# Patient Record
Sex: Male | Born: 1960 | Race: Black or African American | Hispanic: No | Marital: Married | Smoking: Never smoker
Health system: Southern US, Community
[De-identification: ages and names within clinical notes are randomized; demographics above are authoritative.]

## PROBLEM LIST (undated history)

## (undated) DIAGNOSIS — I10 Essential (primary) hypertension: Secondary | ICD-10-CM

## (undated) DIAGNOSIS — E119 Type 2 diabetes mellitus without complications: Secondary | ICD-10-CM

## (undated) DIAGNOSIS — E7800 Pure hypercholesterolemia, unspecified: Secondary | ICD-10-CM

## (undated) HISTORY — PX: SPINAL FUSION: SHX223

---

## 2005-07-30 ENCOUNTER — Ambulatory Visit (HOSPITAL_COMMUNITY): Admission: RE | Admit: 2005-07-30 | Discharge: 2005-07-30 | Payer: Self-pay | Admitting: Neurosurgery

## 2005-08-12 ENCOUNTER — Ambulatory Visit (HOSPITAL_COMMUNITY): Admission: RE | Admit: 2005-08-12 | Discharge: 2005-08-13 | Payer: Self-pay | Admitting: Neurosurgery

## 2011-11-22 ENCOUNTER — Emergency Department (HOSPITAL_COMMUNITY)
Admission: EM | Admit: 2011-11-22 | Discharge: 2011-11-22 | Discharge status: Absent without leave | Payer: Self-pay | Source: Home / Self Care

## 2012-02-09 ENCOUNTER — Encounter (HOSPITAL_BASED_OUTPATIENT_CLINIC_OR_DEPARTMENT_OTHER): Payer: Self-pay | Admitting: *Deleted

## 2012-02-09 ENCOUNTER — Emergency Department (HOSPITAL_BASED_OUTPATIENT_CLINIC_OR_DEPARTMENT_OTHER)
Admission: EM | Admit: 2012-02-09 | Discharge: 2012-02-09 | Disposition: A | Discharge status: Home / Self Care | Payer: 59 | Attending: Emergency Medicine | Admitting: Emergency Medicine

## 2012-02-09 ENCOUNTER — Emergency Department (INDEPENDENT_AMBULATORY_CARE_PROVIDER_SITE_OTHER): Payer: 59

## 2012-02-09 DIAGNOSIS — I10 Essential (primary) hypertension: Secondary | ICD-10-CM | POA: Insufficient documentation

## 2012-02-09 DIAGNOSIS — M549 Dorsalgia, unspecified: Secondary | ICD-10-CM

## 2012-02-09 DIAGNOSIS — N3080 Other cystitis without hematuria: Secondary | ICD-10-CM

## 2012-02-09 DIAGNOSIS — R109 Unspecified abdominal pain: Secondary | ICD-10-CM

## 2012-02-09 DIAGNOSIS — M545 Low back pain, unspecified: Secondary | ICD-10-CM | POA: Insufficient documentation

## 2012-02-09 DIAGNOSIS — E7800 Pure hypercholesterolemia, unspecified: Secondary | ICD-10-CM | POA: Insufficient documentation

## 2012-02-09 DIAGNOSIS — R319 Hematuria, unspecified: Secondary | ICD-10-CM | POA: Insufficient documentation

## 2012-02-09 HISTORY — DX: Pure hypercholesterolemia, unspecified: E78.00

## 2012-02-09 HISTORY — DX: Essential (primary) hypertension: I10

## 2012-02-09 LAB — URINALYSIS, MICROSCOPIC ONLY
Bilirubin Urine: NEGATIVE
Ketones, ur: 15 mg/dL — AB
Leukocytes, UA: NEGATIVE
Protein, ur: NEGATIVE mg/dL
Specific Gravity, Urine: 1.021 (ref 1.005–1.030)
pH: 6 (ref 5.0–8.0)

## 2012-02-09 MED ORDER — HYDROCODONE-ACETAMINOPHEN 5-325 MG PO TABS: 2.0000 | ORAL_TABLET | ORAL | Status: AC | PRN

## 2012-02-09 NOTE — ED Provider Notes (Signed)
Medical screening examination/treatment/procedure(s) were performed by non-physician practitioner and as supervising physician I was immediately available for consultation/collaboration.  Microscopic hematuria. To f/u with urology.  Forbes Cellar, MD 02/09/12 2311
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
## 2012-02-09 NOTE — Discharge Instructions (Signed)
Back Pain, Adult Back pain is very common. The pain often gets better over time. The cause of back pain is usually not dangerous. Most people can learn to manage their back pain on their own.  HOME CARE   Stay active. Start with short walks on flat ground if you can. Try to walk farther each day.   Do not sit, drive, or stand in one place for more than 30 minutes. Do not stay in bed.   Do not avoid exercise or work. Activity can help your back heal faster.   Be careful when you bend or lift an object. Bend at your knees, keep the object close to you, and do not twist.   Sleep on a firm mattress. Lie on your side, and bend your knees. If you lie on your back, put a pillow under your knees.   Only take medicines as told by your doctor.   Put ice on the injured area.   Put ice in a plastic bag.   Place a towel between your skin and the bag.   Leave the ice on for 15 to 20 minutes, 3 to 4 times a day for the first 2 to 3 days. After that, you can switch between ice and heat packs.   Ask your doctor about back exercises or massage.   Avoid feeling anxious or stressed. Find good ways to deal with stress, such as exercise.  GET HELP RIGHT AWAY IF:   Your pain does not go away with rest or medicine.   Your pain does not go away in 1 week.   You have new problems.   You do not feel well.   The pain spreads into your legs.   You cannot control when you poop (bowel movement) or pee (urinate).   Your arms or legs feel weak or lose feeling (numbness).   You feel sick to your stomach (nauseous) or throw up (vomit).   You have belly (abdominal) pain.   You feel like you may pass out (faint).  MAKE SURE YOU:   Understand these instructions.   Will watch your condition.   Will get help right away if you are not doing well or get worse.  Document Released: 05/10/2008 Document Revised: 11/11/2011 Document Reviewed: 04/12/2011 [facility] Patient Information 2012 [place],  [place].Hematuria, Adult Hematuria (blood in your urine) can be caused by a bladder infection (cystitis), kidney infection (pyelonephritis), prostate infection (prostatitis), or kidney stone. Infections will usually respond to antibiotics (medications which kill germs), and a kidney stone will usually pass through your urine without further treatment. If you were put on antibiotics, take all the medicine until gone. You may feel better in a few days, but take all of your medicine or the infection may not respond and become more difficult to treat. If antibiotics were not given, an infection did not cause the blood in the urine. A further work up to find out the reason may be needed. HOME CARE INSTRUCTIONS   Drink lots of fluid, 3 to 4 quarts a day. If you have been diagnosed with an infection, cranberry juice is especially recommended, in addition to large amounts of water.   Avoid caffeine, tea, and carbonated beverages, because they tend to irritate the bladder.   Avoid alcohol as it may irritate the prostate.   Only take over-the-counter or prescription medicines for pain, discomfort, or fever as directed by your caregiver.   If you have been diagnosed with a kidney stone follow your  caregivers instructions regarding straining your urine to catch the stone.  TO PREVENT FURTHER INFECTIONS:  Empty the bladder often. Avoid holding urine for long periods of time.   After a bowel movement, women should cleanse front to back. Use each tissue only once.   Empty the bladder before and after sexual intercourse if you are a male.   Return to your caregiver if you develop back pain, fever, nausea (feeling sick to your stomach), vomiting, or your symptoms (problems) are not better in 3 days. Return sooner if you are getting worse.  If you have been requested to return for further testing make sure to keep your appointments. If an infection is not the cause of blood in your urine, X-rays may be required.  Your caregiver will discuss this with you. SEEK IMMEDIATE MEDICAL CARE IF:   You have a persistent fever over 102 F (38.9 C).   You develop severe vomiting and are unable to keep the medication down.   You develop severe back or abdominal pain despite taking your medications.   You begin passing a large amount of blood or clots in your urine.   You feel extremely weak or faint, or pass out.  MAKE SURE YOU:   Understand these instructions.   Will watch your condition.   Will get help right away if you are not doing well or get worse.  Document Released: 11/22/2005 Document Revised: 11/11/2011 Document Reviewed: 07/11/2008 [facility] Patient Information 2012 [place], [place].
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
## 2016-09-13 ENCOUNTER — Emergency Department (HOSPITAL_COMMUNITY): Payer: Self-pay

## 2016-09-13 ENCOUNTER — Encounter (HOSPITAL_COMMUNITY): Payer: Self-pay | Admitting: Emergency Medicine

## 2016-09-13 ENCOUNTER — Emergency Department (HOSPITAL_COMMUNITY)
Admission: EM | Admit: 2016-09-13 | Discharge: 2016-09-13 | Disposition: A | Discharge status: Home / Self Care | Payer: Self-pay | Attending: Emergency Medicine | Admitting: Emergency Medicine

## 2016-09-13 DIAGNOSIS — Z7984 Long term (current) use of oral hypoglycemic drugs: Secondary | ICD-10-CM | POA: Insufficient documentation

## 2016-09-13 DIAGNOSIS — E119 Type 2 diabetes mellitus without complications: Secondary | ICD-10-CM | POA: Insufficient documentation

## 2016-09-13 DIAGNOSIS — Z79899 Other long term (current) drug therapy: Secondary | ICD-10-CM | POA: Insufficient documentation

## 2016-09-13 DIAGNOSIS — R55 Syncope and collapse: Secondary | ICD-10-CM | POA: Insufficient documentation

## 2016-09-13 DIAGNOSIS — I10 Essential (primary) hypertension: Secondary | ICD-10-CM | POA: Insufficient documentation

## 2016-09-13 HISTORY — DX: Type 2 diabetes mellitus without complications: E11.9

## 2016-09-13 LAB — CBC
HCT: 38.4 % — ABNORMAL LOW (ref 39.0–52.0)
Hemoglobin: 12.6 g/dL — ABNORMAL LOW (ref 13.0–17.0)
MCH: 27.2 pg (ref 26.0–34.0)
MCHC: 32.8 g/dL (ref 30.0–36.0)
MCV: 82.8 fL (ref 78.0–100.0)
Platelets: 253 10*3/uL (ref 150–400)
RBC: 4.64 MIL/uL (ref 4.22–5.81)
RDW: 14 % (ref 11.5–15.5)
WBC: 3.7 10*3/uL — ABNORMAL LOW (ref 4.0–10.5)

## 2016-09-13 LAB — BASIC METABOLIC PANEL
Anion gap: 10 (ref 5–15)
BUN: 19 mg/dL (ref 6–20)
CO2: 25 mmol/L (ref 22–32)
Calcium: 9.7 mg/dL (ref 8.9–10.3)
Chloride: 99 mmol/L — ABNORMAL LOW (ref 101–111)
Creatinine, Ser: 1.39 mg/dL — ABNORMAL HIGH (ref 0.61–1.24)
GFR calc Af Amer: >60 mL/min (ref >60)
GFR calc non Af Amer: 56 mL/min — ABNORMAL LOW (ref >60)
Glucose, Bld: 181 mg/dL — ABNORMAL HIGH (ref 65–99)
Potassium: 3.4 mmol/L — ABNORMAL LOW (ref 3.5–5.1)
Sodium: 134 mmol/L — ABNORMAL LOW (ref 135–145)

## 2016-09-13 LAB — I-STAT TROPONIN, ED
TROPONIN I, POC: 0.01 ng/mL (ref 0.00–0.08)
Troponin i, poc: 0.01 ng/mL (ref 0.00–0.08)

## 2016-09-13 LAB — D-DIMER, QUANTITATIVE: D-Dimer, Quant: <0.27 ug/mL-FEU (ref 0.00–0.50)

## 2016-09-13 LAB — URINE MICROSCOPIC-ADD ON

## 2016-09-13 LAB — URINALYSIS, ROUTINE W REFLEX MICROSCOPIC
Bilirubin Urine: NEGATIVE
Glucose, UA: >1000 mg/dL — AB
Hgb urine dipstick: NEGATIVE
Ketones, ur: NEGATIVE mg/dL
Leukocytes, UA: NEGATIVE
Nitrite: NEGATIVE
Protein, ur: NEGATIVE mg/dL
Specific Gravity, Urine: 1.027 (ref 1.005–1.030)
pH: 5.5 (ref 5.0–8.0)

## 2016-09-13 NOTE — Discharge Instructions (Signed)
All of your labs and imaging have been unremarkable today. Please continue to take all the medications and drink plenty of fluids. Continue to check her blood sugar regularly. He also had follow-up with her primary care doctor outpatient for further workup. Please return to the ED if you develop chest pain, shortness of breath or for any other reason.
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
## 2016-09-13 NOTE — ED Notes (Signed)
Bed: WA06 Expected date:  Expected time:  Means of arrival:  Comments: 55 yo M, syncope

## 2016-09-13 NOTE — ED Triage Notes (Signed)
Per EMS, Pt from work. Pt had syncopal episode, unwitnessed. Pt denies injury from the fall. After initial syncopal episode pt still felt lightheaded. A&Ox4, ambulatory.

## 2020-08-21 ENCOUNTER — Other Ambulatory Visit: Payer: Self-pay

## 2020-08-21 ENCOUNTER — Other Ambulatory Visit: Payer: Self-pay | Admitting: *Deleted

## 2020-08-21 DIAGNOSIS — Z20822 Contact with and (suspected) exposure to covid-19: Secondary | ICD-10-CM

## 2020-08-23 LAB — SARS-COV-2, NAA 2 DAY TAT

## 2020-08-23 LAB — NOVEL CORONAVIRUS, NAA: SARS-CoV-2, NAA: NOT DETECTED

## 2020-08-23 LAB — SPECIMEN STATUS REPORT

## 2021-03-19 ENCOUNTER — Other Ambulatory Visit: Payer: Self-pay | Admitting: Physician Assistant

## 2021-03-19 DIAGNOSIS — M5417 Radiculopathy, lumbosacral region: Secondary | ICD-10-CM

## 2021-03-22 ENCOUNTER — Other Ambulatory Visit: Payer: Self-pay

## 2021-03-22 ENCOUNTER — Ambulatory Visit
Admission: RE | Admit: 2021-03-22 | Discharge: 2021-03-22 | Disposition: A | Discharge status: Home / Self Care | Payer: Managed Care, Other (non HMO) | Source: Ambulatory Visit | Attending: Physician Assistant | Admitting: Physician Assistant

## 2021-03-22 DIAGNOSIS — M5417 Radiculopathy, lumbosacral region: Secondary | ICD-10-CM

## 2021-11-20 IMAGING — MR MR LUMBAR SPINE W/O CM
4 of 5 series · 18 of 48 positions shown · non-contrast
Comparison: None available.

CLINICAL DATA: Initial evaluation for intermittent left-sided lower
back pain since 4188, numbness and weakness in left lower extremity.

EXAM:
MRI LUMBAR SPINE WITHOUT CONTRAST
TECHNIQUE: Multiplanar, multisequence MR imaging of the lumbar spine was
performed. No intravenous contrast was administered.

[Series 5: T2 · sagittal · 4.0mm · 0.73mm/px · 6 of 15 slices shown (1 of 2)]
[im 1/15]
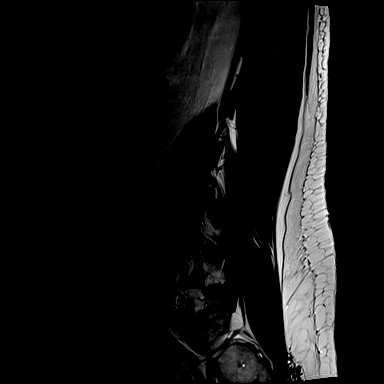
[im 3/15]
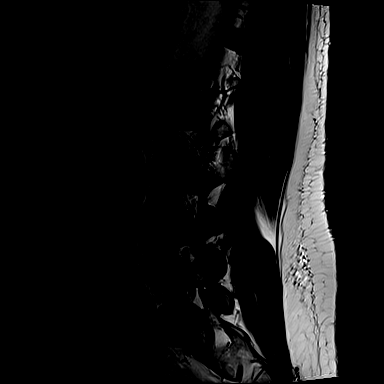
[im 6/15]
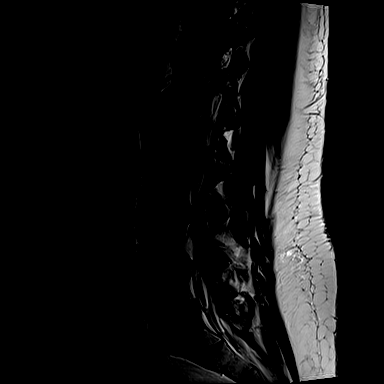
[im 9/15]
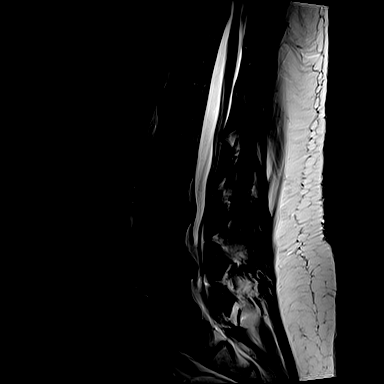
[im 12/15]
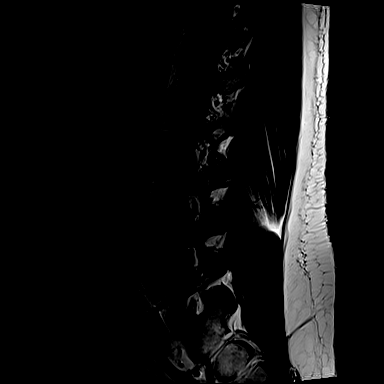
[im 15/15]
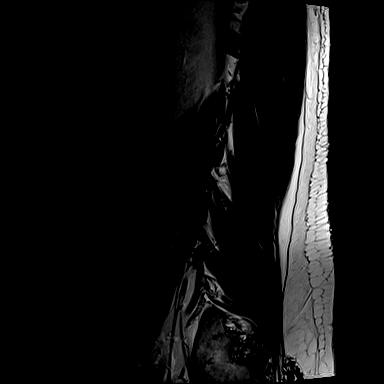

[Series 6: T1 · sagittal · 4.0mm · 0.73mm/px · 3 of 15 slices shown (1 of 2)]
[im 1/15]
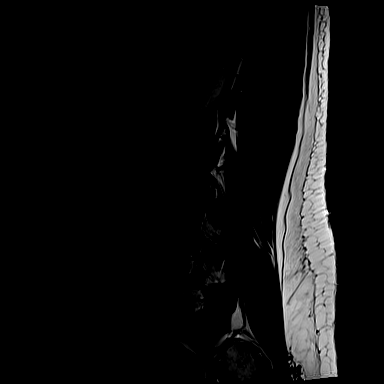
[im 8/15]
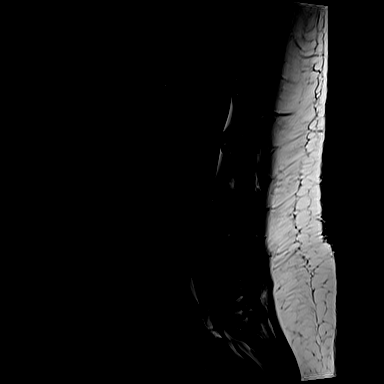
[im 15/15]
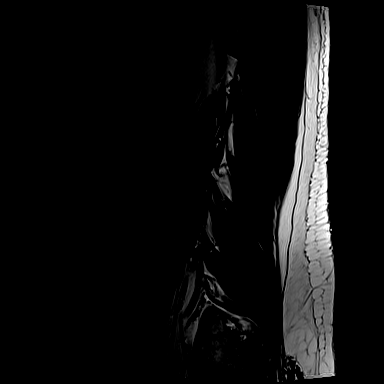

[Series 11: T2 · axial · 4.0mm · 0.28mm/px · z∈[-126,+73]mm · 6 of 45 slices shown (2 of 2)]
[im 3/45]
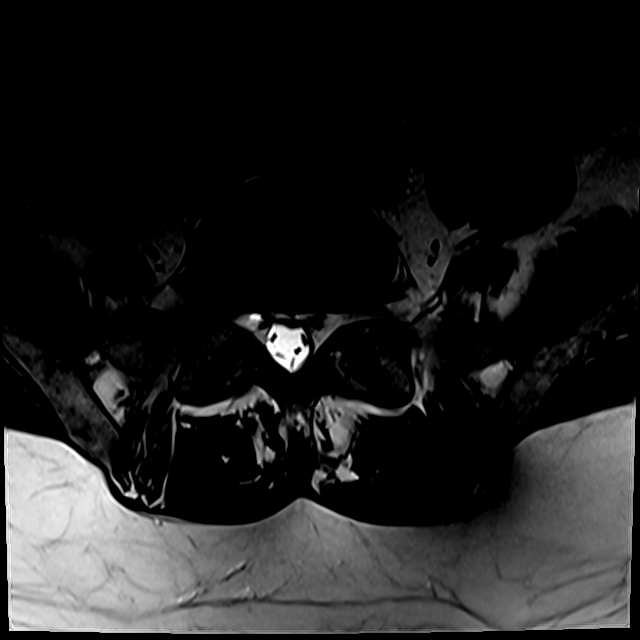
[im 6/45]
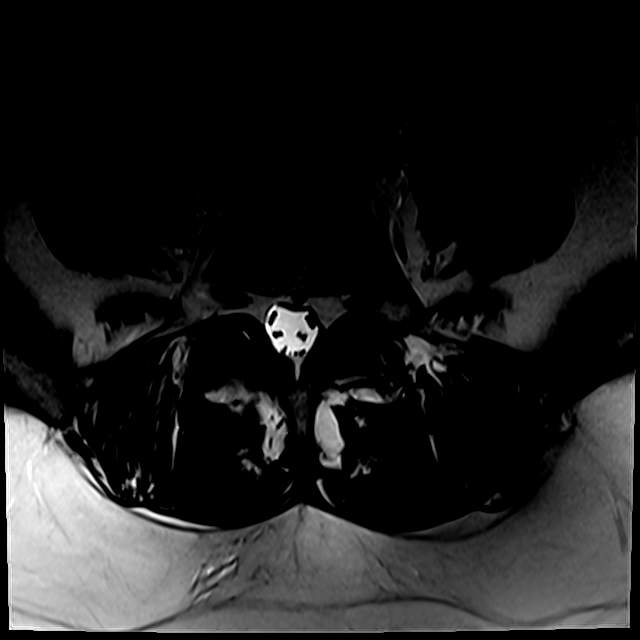
[im 9/45]
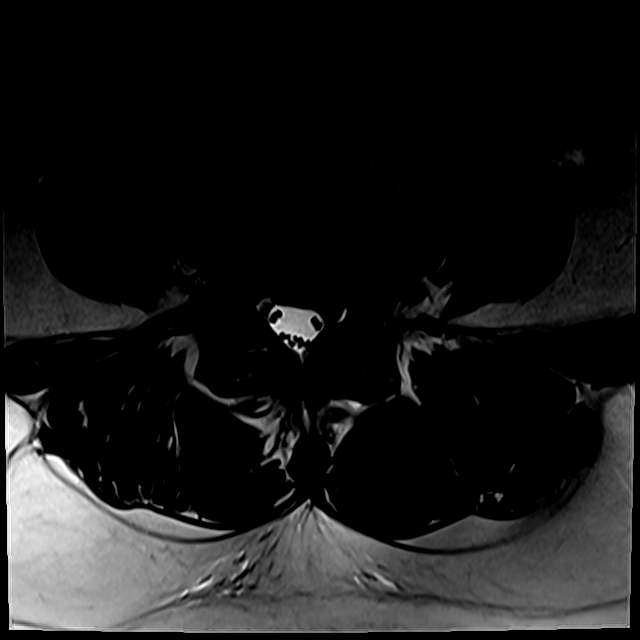
[im 15/45]
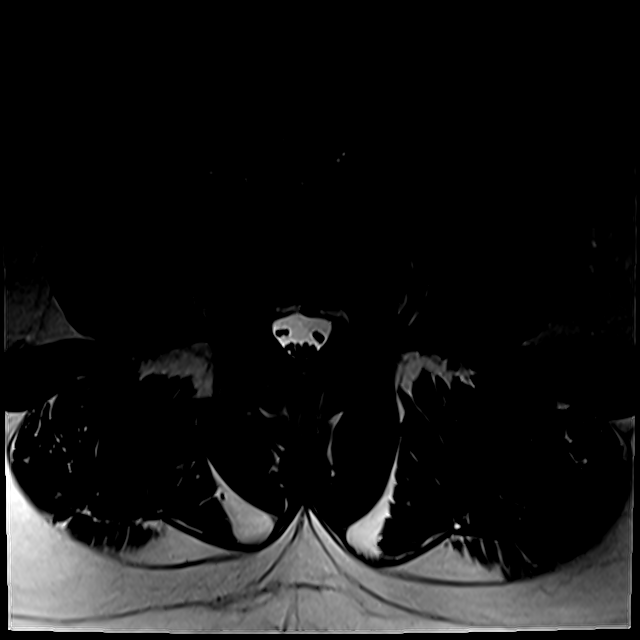
[im 24/45]
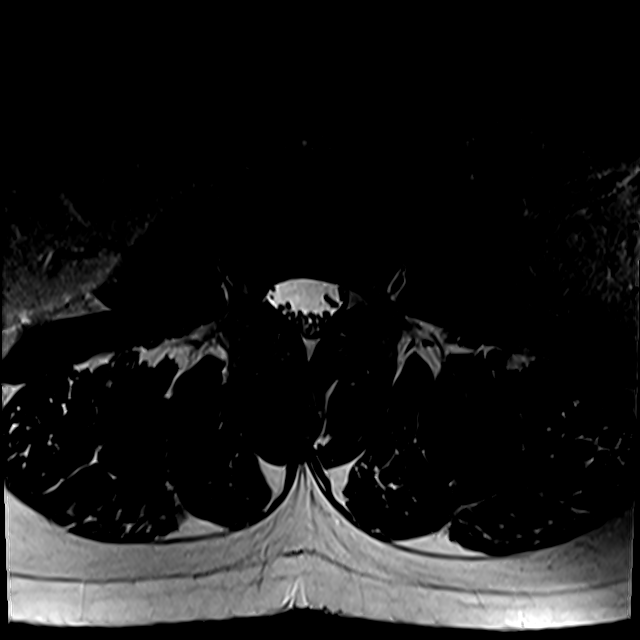
[im 39/45]
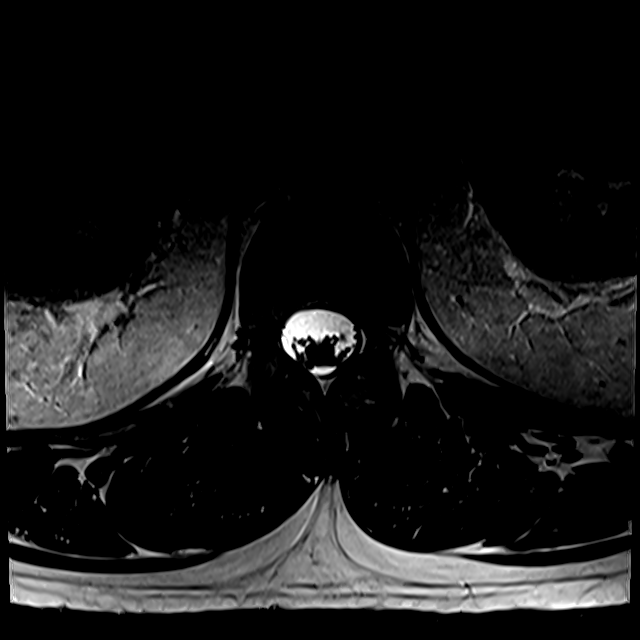

[Series 100: T1 · axial · 4.0mm · 0.28mm/px · z∈[-111,+73]mm · 3 of 45 slices shown (2 of 2)]
[im 6/45]
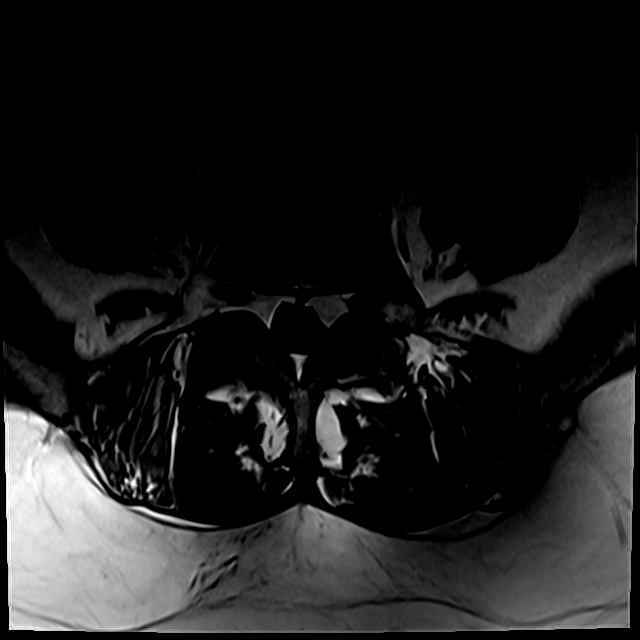
[im 24/45]
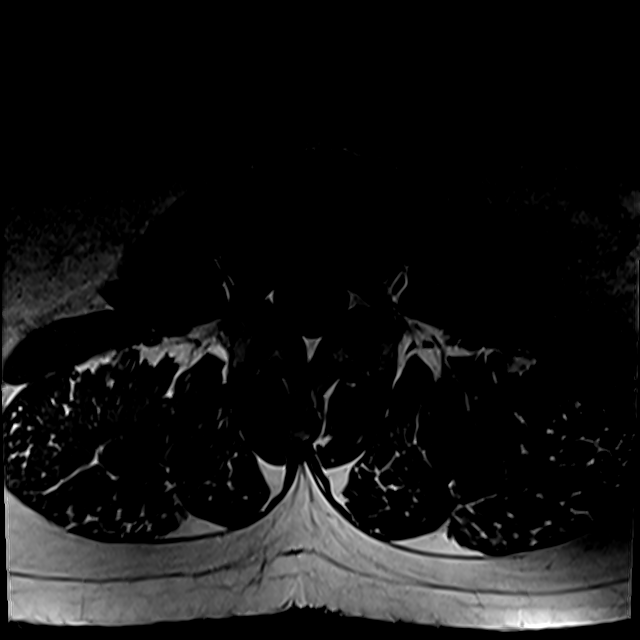
[im 39/45]
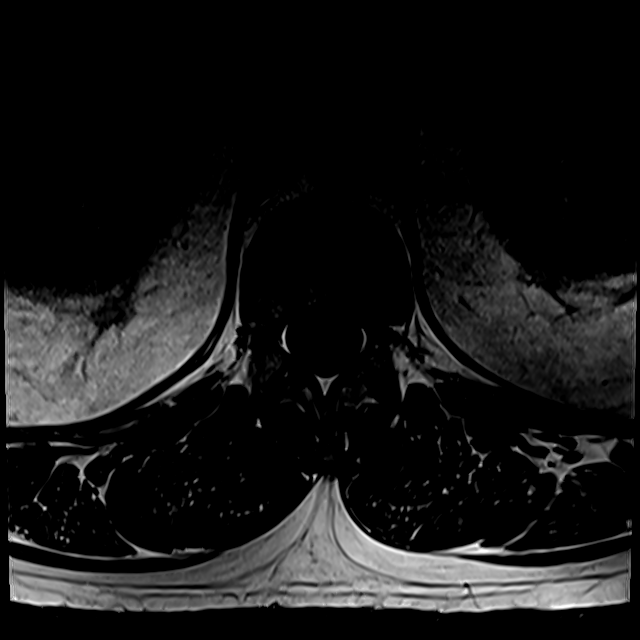

[18 of 48 positions shown; findings below may reference images not displayed]

FINDINGS: Segmentation: Standard. Lowest well-formed disc space labeled the
L5-S1 level.

Alignment: Vertebral bodies normally aligned with preservation of
the normal lumbar lordosis. No listhesis.

Vertebrae: Vertebral body height maintained without acute or chronic
fracture. Bone marrow signal intensity within normal limits. No
discrete or worrisome osseous lesions. No abnormal marrow edema.

Conus medullaris and cauda equina: Conus extends to the L1 level.
Conus and cauda equina appear normal.

Paraspinal and other soft tissues: Paraspinous soft tissues within
normal limits. Partially visualized visceral structures grossly
unremarkable.

Disc levels:

L1-2:  Unremarkable.

L2-3:  Unremarkable.

L3-4: Mild disc bulge with disc desiccation. Superimposed left
subarticular disc extrusion with inferior migration, extending to
the level of the L4-5 interspace inferiorly (series 11, image 27).
Disc material closely approximates the descending left L4 nerve root
as it courses through the lateral recess. Mild facet hypertrophy.
Resultant mild narrowing of the left lateral recess. Central canal
remains patent. No significant foraminal stenosis.

L4-5: Diffuse disc bulge with disc desiccation and intervertebral
disc space narrowing. Chronic endplate Schmorl's node deformity at
the superior endplate of L5. Superimposed broad-based left
subarticular to foraminal disc protrusion (series 11, image 36).
Associated slight inferior migration with annular fissure (series
11, image 37). Disc material closely approximates and/or contacts
both the exiting left L4 and descending L5 nerve roots, either which
could be affected superimposed mild facet and ligament flavum
hypertrophy. Mild to moderate left foraminal and left lateral recess
stenosis. Relatively mild narrowing of the central canal and right
neural foramen.

L5-S1: Disc desiccation with minimal disc bulge, slightly eccentric
to the right. Mild facet hypertrophy. No canal or foraminal
stenosis. No impingement.
IMPRESSION: 1. Broad-based left subarticular to foraminal disc protrusion at
L4-5, closely approximating and/or potentially irritating either of
the exiting left L4 or descending L5 nerve roots.
2. Left subarticular disc extrusion with inferior migration at L3-4,
potentially affecting the descending left L4 nerve root.
3. Mild facet hypertrophy at L3-4 through L5-S1, which could
contribute to lower back pain.

## 2023-06-12 ENCOUNTER — Encounter (HOSPITAL_COMMUNITY): Payer: Self-pay

## 2023-06-12 ENCOUNTER — Emergency Department (HOSPITAL_COMMUNITY): Payer: BLUE CROSS/BLUE SHIELD

## 2023-06-12 ENCOUNTER — Observation Stay (HOSPITAL_COMMUNITY): Payer: BLUE CROSS/BLUE SHIELD

## 2023-06-12 ENCOUNTER — Observation Stay (HOSPITAL_COMMUNITY)
Admission: EM | Admit: 2023-06-12 | Discharge: 2023-06-14 | Disposition: A | Discharge status: Home / Self Care | Payer: BLUE CROSS/BLUE SHIELD | Attending: Internal Medicine | Admitting: Internal Medicine

## 2023-06-12 ENCOUNTER — Other Ambulatory Visit: Payer: Self-pay

## 2023-06-12 DIAGNOSIS — E669 Obesity, unspecified: Secondary | ICD-10-CM | POA: Diagnosis not present

## 2023-06-12 DIAGNOSIS — I4891 Unspecified atrial fibrillation: Secondary | ICD-10-CM | POA: Diagnosis not present

## 2023-06-12 DIAGNOSIS — Z23 Encounter for immunization: Secondary | ICD-10-CM | POA: Insufficient documentation

## 2023-06-12 DIAGNOSIS — I10 Essential (primary) hypertension: Secondary | ICD-10-CM | POA: Insufficient documentation

## 2023-06-12 DIAGNOSIS — Z7984 Long term (current) use of oral hypoglycemic drugs: Secondary | ICD-10-CM | POA: Diagnosis not present

## 2023-06-12 DIAGNOSIS — Z6836 Body mass index (BMI) 36.0-36.9, adult: Secondary | ICD-10-CM | POA: Diagnosis not present

## 2023-06-12 DIAGNOSIS — Z79899 Other long term (current) drug therapy: Secondary | ICD-10-CM | POA: Insufficient documentation

## 2023-06-12 DIAGNOSIS — E119 Type 2 diabetes mellitus without complications: Secondary | ICD-10-CM | POA: Diagnosis not present

## 2023-06-12 DIAGNOSIS — R55 Syncope and collapse: Secondary | ICD-10-CM | POA: Diagnosis present

## 2023-06-12 DIAGNOSIS — E785 Hyperlipidemia, unspecified: Secondary | ICD-10-CM | POA: Diagnosis not present

## 2023-06-12 LAB — TROPONIN I (HIGH SENSITIVITY)
Troponin I (High Sensitivity): 9 ng/L (ref <18)
Troponin I (High Sensitivity): 17 ng/L (ref <18)

## 2023-06-12 LAB — CBC
HCT: 44 % (ref 39.0–52.0)
Hemoglobin: 14.2 g/dL (ref 13.0–17.0)
MCH: 27.4 pg (ref 26.0–34.0)
MCHC: 32.3 g/dL (ref 30.0–36.0)
MCV: 84.8 fL (ref 80.0–100.0)
Platelets: 221 10*3/uL (ref 150–400)
RBC: 5.19 MIL/uL (ref 4.22–5.81)
RDW: 14.1 % (ref 11.5–15.5)
WBC: 3.5 10*3/uL — ABNORMAL LOW (ref 4.0–10.5)
nRBC: 0 % (ref 0.0–0.2)

## 2023-06-12 LAB — BASIC METABOLIC PANEL
Anion gap: 10 (ref 5–15)
BUN: 10 mg/dL (ref 6–20)
CO2: 23 mmol/L (ref 22–32)
Calcium: 9.3 mg/dL (ref 8.9–10.3)
Chloride: 103 mmol/L (ref 98–111)
Creatinine, Ser: 1.18 mg/dL (ref 0.61–1.24)
GFR, Estimated: >60 mL/min (ref >60)
Glucose, Bld: 142 mg/dL — ABNORMAL HIGH (ref 70–99)
Potassium: 4 mmol/L (ref 3.5–5.1)
Sodium: 136 mmol/L (ref 135–145)

## 2023-06-12 LAB — MAGNESIUM: Magnesium: 2 mg/dL (ref 1.7–2.4)

## 2023-06-12 LAB — CBG MONITORING, ED: Glucose-Capillary: 154 mg/dL — ABNORMAL HIGH (ref 70–99)

## 2023-06-12 LAB — PROTIME-INR
INR: 1 (ref 0.8–1.2)
Prothrombin Time: 12.9 seconds (ref 11.4–15.2)

## 2023-06-12 LAB — BRAIN NATRIURETIC PEPTIDE: B Natriuretic Peptide: 43.2 pg/mL (ref 0.0–100.0)

## 2023-06-12 LAB — TSH: TSH: 0.587 u[IU]/mL (ref 0.350–4.500)

## 2023-06-12 MED ORDER — LABETALOL HCL 5 MG/ML IV SOLN
10.0000 mg | INTRAVENOUS | Status: DC | PRN
  Administered 2023-06-14: 10 mg via INTRAVENOUS
  Filled 2023-06-12: qty 4

## 2023-06-12 MED ORDER — TETANUS-DIPHTH-ACELL PERTUSSIS 5-2.5-18.5 LF-MCG/0.5 IM SUSY
0.5000 mL | PREFILLED_SYRINGE | Freq: Once | INTRAMUSCULAR | Status: AC
  Administered 2023-06-12: 0.5 mL via INTRAMUSCULAR
  Filled 2023-06-12: qty 0.5

## 2023-06-12 MED ORDER — INSULIN ASPART 100 UNIT/ML IJ SOLN
0.0000 [IU] | Freq: Three times a day (TID) | INTRAMUSCULAR | Status: DC
  Administered 2023-06-13: 3 [IU] via SUBCUTANEOUS
  Administered 2023-06-13 – 2023-06-14 (×2): 2 [IU] via SUBCUTANEOUS

## 2023-06-12 MED ORDER — HEPARIN BOLUS VIA INFUSION
5500.0000 [IU] | Freq: Once | INTRAVENOUS | Status: AC
  Administered 2023-06-12: 5500 [IU] via INTRAVENOUS
  Filled 2023-06-12: qty 5500

## 2023-06-12 MED ORDER — DILTIAZEM HCL-DEXTROSE 125-5 MG/125ML-% IV SOLN (PREMIX)
5.0000 mg/h | INTRAVENOUS | Status: DC
  Administered 2023-06-12 – 2023-06-13 (×2): 5 mg/h via INTRAVENOUS
  Filled 2023-06-12 (×2): qty 125

## 2023-06-12 MED ORDER — OXYCODONE-ACETAMINOPHEN 5-325 MG PO TABS
1.0000 | ORAL_TABLET | Freq: Once | ORAL | Status: AC
  Administered 2023-06-12: 1 via ORAL
  Filled 2023-06-12: qty 1

## 2023-06-12 MED ORDER — IOHEXOL 350 MG/ML SOLN
75.0000 mL | Freq: Once | INTRAVENOUS | Status: AC | PRN
  Administered 2023-06-12: 75 mL via INTRAVENOUS

## 2023-06-12 MED ORDER — ONDANSETRON HCL 4 MG/2ML IJ SOLN: 4.0000 mg | Freq: Four times a day (QID) | INTRAMUSCULAR | Status: DC | PRN

## 2023-06-12 MED ORDER — OXYCODONE HCL 5 MG PO TABS: 5.0000 mg | ORAL_TABLET | Freq: Four times a day (QID) | ORAL | Status: DC | PRN

## 2023-06-12 MED ORDER — ACETAMINOPHEN 325 MG PO TABS
650.0000 mg | ORAL_TABLET | Freq: Once | ORAL | Status: AC
  Administered 2023-06-12: 650 mg via ORAL
  Filled 2023-06-12: qty 2

## 2023-06-12 MED ORDER — DILTIAZEM LOAD VIA INFUSION
10.0000 mg | Freq: Once | INTRAVENOUS | Status: AC
  Administered 2023-06-12: 10 mg via INTRAVENOUS
  Filled 2023-06-12: qty 10

## 2023-06-12 MED ORDER — ACETAMINOPHEN 325 MG PO TABS
650.0000 mg | ORAL_TABLET | ORAL | Status: DC | PRN
  Administered 2023-06-13: 650 mg via ORAL
  Filled 2023-06-12: qty 2

## 2023-06-12 MED ORDER — ROSUVASTATIN CALCIUM 20 MG PO TABS
20.0000 mg | ORAL_TABLET | Freq: Every day | ORAL | Status: DC
  Administered 2023-06-12 – 2023-06-14 (×3): 20 mg via ORAL
  Filled 2023-06-12 (×3): qty 1

## 2023-06-12 MED ORDER — HEPARIN (PORCINE) 25000 UT/250ML-% IV SOLN
1400.0000 [IU]/h | INTRAVENOUS | Status: DC
  Administered 2023-06-12: 1500 [IU]/h via INTRAVENOUS
  Administered 2023-06-13: 1400 [IU]/h via INTRAVENOUS
  Filled 2023-06-12 (×2): qty 250

## 2023-06-12 NOTE — H&P (Addendum)
[facility] History and Physical  SAVYON MOCKLER ZDG:[id number] DOB: [birth year] DOA: 06/12/2023   PCP: Patient, No Pcp Per  Specialists: None  Chief Complaint: Passed out  HPI: Travis Harmon is a 62 y.o. male with a past medical history of essential hypertension and diabetes mellitus type 2 and hyperlipidemia who was in his usual state of health till earlier today when after lunch he was walking across a parking lot when he felt dizzy and then subsequently passed out.  He does not remember the events clearly.  He fell backwards and hit the back of his head on the pavement.  EMS was called.  They noted that he was tachycardic with heart rate in the 140s.  Rhythm was noted to be irregular.  They gave him 20 mg of Cardizem with improvement in heart rate.  He was brought into the emergency department.  Patient denies any chest pain or shortness of breath prior to or since the episode of syncope.  Denies any palpitations.  No illness or sickness recently.  No travel recently.  Denies any road trips recently.  No previous history of same.  Currently complains of pain in the back of his head where he fell.  No nausea or vomiting.  Denies any history of bleeding episodes recently.  In the emergency department patient was started on Cardizem infusion.  He underwent CT scan of the head and cervical spine which was negative for any acute trauma.  Heart rate remains elevated.  Hospitalist was called for admission.  Home Medications: Prior to Admission medications   Medication Sig Start Date End Date Taking? Authorizing Provider  amLODipine-valsartan (EXFORGE) 10-160 MG tablet Take 1 tablet by mouth daily.    [provider]  chlorthalidone (HYGROTON) 25 MG tablet Take 25 mg by mouth daily.    [provider]  JARDIANCE 10 MG TABS tablet Take 10 mg by mouth daily. 09/06/16   [provider]  metFORMIN (GLUCOPHAGE-XR) 750 MG 24 hr tablet Take 750 mg by mouth 2 (two) times  daily.    [provider]  rosuvastatin (CRESTOR) 20 MG tablet Take 20 mg by mouth daily.    [provider]    Allergies: No Known Allergies  Past Medical History: Past Medical History:  Diagnosis Date   Diabetes mellitus without complication (HCC)    Hypercholesteremia    Hypertension     Past Surgical History:  Procedure Laterality Date   SPINAL FUSION      Social History: Lives with his family.  Denies smoking alcohol use or illicit drug use.  Usually independent with daily activities.  Up-to-date on his colonoscopy which was last done about 3 years ago which was unremarkable per patient.  Family History:  Family History  Problem Relation Age of Onset   Coronary artery disease Father      Review of Systems - History obtained from the patient General ROS: positive for  - fatigue Psychological ROS: positive for - anxiety Ophthalmic ROS: negative ENT ROS: negative Allergy and Immunology ROS: negative Hematological and Lymphatic ROS: negative Endocrine ROS: negative Respiratory ROS: no cough, shortness of breath, or wheezing Cardiovascular ROS: no chest pain or dyspnea on exertion Gastrointestinal ROS: no abdominal pain, change in bowel habits, or black or bloody stools Genito-Urinary ROS: no dysuria, trouble voiding, or hematuria Musculoskeletal ROS: negative Neurological ROS: no TIA or stroke symptoms Dermatological ROS: negative  Physical Examination  Vitals:   06/12/23 1652 06/12/23 1710 06/12/23 1725 06/12/23 1757  BP:      Pulse: 100 (!) 107 97 94  Resp: 17 13 15 17   Temp:      TempSrc:      SpO2: 100% 100% 99% 99%  Weight:      Height:        BP (!) 178/105   Pulse 94   Temp 97.6 F (36.4 C) (Oral)   Resp 17   Ht 6' (1.829 m)   Wt 122.5 kg   SpO2 99%   BMI 36.62 kg/m   General appearance: alert, cooperative, appears stated age, and no distress Head: Normocephalic, without obvious abnormality.  Small bump identified in the  back of the head on the left. Eyes: conjunctivae/corneas clear. PERRL, EOM's intact. Throat: lips, mucosa, and tongue normal; teeth and gums normal Neck: no adenopathy, no carotid bruit, no JVD, supple, symmetrical, trachea midline, and thyroid not enlarged, symmetric, no tenderness/mass/nodules Resp: clear to auscultation bilaterally Cardio: S1-S2 is irregularly irregular, tachycardic.  No S3-S4.  No murmurs appreciated GI: soft, non-tender; bowel sounds normal; no masses,  no organomegaly Extremities: extremities normal, atraumatic, no cyanosis or edema Pulses: 2+ and symmetric Skin: Skin color, texture, turgor normal. No rashes or lesions Lymph nodes: Cervical, supraclavicular, and axillary nodes normal. Neurologic: Alert and oriented x 3.  No obvious focal neurological deficits.  Cranial nerves II to XII intact.  Motor strength equal bilateral upper and lower extremities.   Labs on Admission: I have personally reviewed following labs and imaging studies  CBC: Recent Labs  Lab 06/12/23 1526  WBC 3.5*  HGB 14.2  HCT 44.0  MCV 84.8  PLT 221   Basic Metabolic Panel: Recent Labs  Lab 06/12/23 1526  NA 136  K 4.0  CL 103  CO2 23  GLUCOSE 142*  BUN 10  CREATININE 1.18  CALCIUM 9.3  MG 2.0   GFR: Estimated Creatinine Clearance: 90 mL/min (by C-G formula based on SCr of 1.18 mg/dL).  Coagulation Profile: Recent Labs  Lab 06/12/23 1526  INR 1.0    Thyroid Function Tests: Recent Labs    06/12/23 1526  TSH 0.587    Radiological Exams on Admission: CT Head Wo Contrast  Result Date: 06/12/2023 CLINICAL DATA:  Head and neck trauma EXAM: CT HEAD WITHOUT CONTRAST CT CERVICAL SPINE WITHOUT CONTRAST TECHNIQUE: Multidetector CT imaging of the head and cervical spine was performed following the standard protocol without intravenous contrast. Multiplanar CT image reconstructions of the cervical spine were also generated. RADIATION DOSE REDUCTION: This exam was performed  according to the departmental dose-optimization program which includes automated exposure control, adjustment of the mA and/or kV according to patient size and/or use of iterative reconstruction technique. COMPARISON:  None Available. FINDINGS: CT HEAD FINDINGS Brain: No evidence of acute infarction, hemorrhage, hydrocephalus, extra-axial collection or mass lesion/mass effect. Vascular: No hyperdense vessel or unexpected calcification. Skull: Normal. Negative for fracture or focal lesion. Sinuses/Orbits: No acute finding. Other: None. CT CERVICAL SPINE FINDINGS Alignment: Normal. Skull base and vertebrae: No acute fracture. No primary bone lesion or focal pathologic process. Soft tissues and spinal canal: No prevertebral fluid or swelling. No visible canal hematoma. Disc levels: Anterior cervical discectomy and fusion of C5-C6. Generally mild disc space height loss and osteophytosis otherwise. Upper chest: Negative. Other: None. IMPRESSION: 1. No acute intracranial pathology. 2. No fracture or static subluxation of the cervical spine. 3. Anterior cervical discectomy and fusion of C5-C6. Electronically Signed   By: Jearld Lesch M.D.   On: 06/12/2023 16:28  CT Cervical Spine Wo Contrast  Result Date: 06/12/2023 CLINICAL DATA:  Head and neck trauma EXAM: CT HEAD WITHOUT CONTRAST CT CERVICAL SPINE WITHOUT CONTRAST TECHNIQUE: Multidetector CT imaging of the head and cervical spine was performed following the standard protocol without intravenous contrast. Multiplanar CT image reconstructions of the cervical spine were also generated. RADIATION DOSE REDUCTION: This exam was performed according to the departmental dose-optimization program which includes automated exposure control, adjustment of the mA and/or kV according to patient size and/or use of iterative reconstruction technique. COMPARISON:  None Available. FINDINGS: CT HEAD FINDINGS Brain: No evidence of acute infarction, hemorrhage, hydrocephalus,  extra-axial collection or mass lesion/mass effect. Vascular: No hyperdense vessel or unexpected calcification. Skull: Normal. Negative for fracture or focal lesion. Sinuses/Orbits: No acute finding. Other: None. CT CERVICAL SPINE FINDINGS Alignment: Normal. Skull base and vertebrae: No acute fracture. No primary bone lesion or focal pathologic process. Soft tissues and spinal canal: No prevertebral fluid or swelling. No visible canal hematoma. Disc levels: Anterior cervical discectomy and fusion of C5-C6. Generally mild disc space height loss and osteophytosis otherwise. Upper chest: Negative. Other: None. IMPRESSION: 1. No acute intracranial pathology. 2. No fracture or static subluxation of the cervical spine. 3. Anterior cervical discectomy and fusion of C5-C6. Electronically Signed   By: Jearld Lesch M.D.   On: 06/12/2023 16:28   DG Chest Port 1 View  Result Date: 06/12/2023 CLINICAL DATA:  fall EXAM: PORTABLE CHEST 1 VIEW COMPARISON:  Chest, 09/13/2016. FINDINGS: Cardiac silhouette is within normal limits. Aortic arch calcifications. Lungs are hypoinflated. No focal consolidation or mass. No pleural effusion or pneumothorax. Cervical fusion hardware, imaged. No acute displaced fracture. IMPRESSION: 1. Hypoinflation without acute superimposed cardiopulmonary process. 2.  Aortic Atherosclerosis (ICD10-I70.0). Electronically Signed   By: Roanna Banning M.D.   On: 06/12/2023 15:20    My interpretation of Electrocardiogram: Atrial fibrillation at 108 bpm.  Normal axis.  Normal intervals.  Diffuse T wave inversion noted.  No concerning ST segment changes.  Some of the changes are similar to EKG from 2017.   Problem List  Principal Problem:   Atrial fibrillation with rapid ventricular response (HCC) Active Problems:   Syncope   Essential hypertension   DM (diabetes mellitus), type 2 (HCC)   Atrial fibrillation (HCC)   Assessment: This is a 62 year old African-American male with past medical history  as stated earlier who comes in after syncopal episode.  He was found to be in atrial fibrillation with RVR.  Syncope most likely secondary to A-fib with RVR.  No identifiable triggers for his atrial fibrillation.  TSH is normal.  PE needs to be considered.  Plan:  #1. Atrial fibrillation with RVR: TSH is normal.  Echocardiogram will be ordered.  CT angiogram chest will be done to rule out PE.  Monitor on telemetry.  Diltiazem infusion has been ordered by ED physician which will be continued.  IV heparin will be initiated.  If he does not convert to sinus rhythm will involve cardiology to consider cardioversion.  Troponins are normal.  #2.  Syncope: As above.  Follow-up on echocardiogram.  #3.  Essential hypertension: Hold his antihypertensives for now.  Labetalol as needed.  #4.  Diabetes mellitus type 2: Check HbA1c.  Hold his diabetic medications.  SSI.  #5.  Hyperlipidemia: Check lipid panel.  Continue statin.   DVT Prophylaxis: On IV heparin Code Status: Full code Family Communication: Discussed with patient and his wife and son Disposition: Hopefully return home when improved Consults called:  Cardiology will be consulted in the morning Admission Status: Status is: Observation The patient remains OBS appropriate and will d/c before 2 midnights.    Severity of Illness: The appropriate patient status for this patient is OBSERVATION. Observation status is judged to be reasonable and necessary in order to provide the required intensity of service to ensure the patient's safety. The patient's presenting symptoms, physical exam findings, and initial radiographic and laboratory data in the context of their medical condition is felt to place them at decreased risk for further clinical deterioration. Furthermore, it is anticipated that the patient will be medically stable for discharge from the hospital within 2 midnights of admission.    Further management decisions will depend on results of  further testing and patient's response to treatment.   Jemarion Roycroft [employer]  [facility] [email or web address]  06/12/2023, 6:06 PM

## 2023-06-12 NOTE — ED Provider Notes (Signed)
[facility] Provider Note   CSN: [id number] Arrival date & time: 06/12/23  1438     History  Chief Complaint  Patient presents with   Tachycardia    Travis Harmon is a 62 y.o. male.  HPI Patient presents for syncope.  Medical history includes DM, HTN, HLD.  Patient reports that he was in his normal state of health earlier today.  After lunch, he was walking across a parking lot when he had a prodrome of dizziness.  He subsequently had a loss of consciousness.  At this time, he fell backwards.  He did strike the back of his head on the pavement.  When EMS arrived on scene, they noted tachycardia in the range of 140s to 190s.  Rhythm was irregularly irregular.  Heart rate did not improve with IV fluids.  He was given 20 mg of Cardizem with improved heart rate to the range of 100s to 130s.  Patient remained normotensive.  Currently, he is on Plavix.  He is not on other anticoagulation medication.  Patient endorses pain to the back of his head and side of neck.  He denies any other areas of discomfort.  He continues to have dizziness when he sits up.    Home Medications Prior to Admission medications   Medication Sig Start Date End Date Taking? Authorizing Provider  amLODipine-valsartan (EXFORGE) 10-160 MG tablet Take 1 tablet by mouth daily.    [provider]  chlorthalidone (HYGROTON) 25 MG tablet Take 25 mg by mouth daily.    [provider]  JARDIANCE 10 MG TABS tablet Take 10 mg by mouth daily. 09/06/16   [provider]  metFORMIN (GLUCOPHAGE-XR) 750 MG 24 hr tablet Take 750 mg by mouth 2 (two) times daily.    [provider]  rosuvastatin (CRESTOR) 20 MG tablet Take 20 mg by mouth daily.    [provider]      Allergies    Patient has no known allergies.    Review of Systems   Review of Systems  Musculoskeletal:  Positive for neck pain.  Neurological:  Positive for syncope and headaches.   All other systems reviewed and are negative.   Physical Exam Updated Vital Signs BP (!) 178/105   Pulse 97   Temp 97.6 F (36.4 C) (Oral)   Resp 15   Ht 6' (1.829 m)   Wt 122.5 kg   SpO2 99%   BMI 36.62 kg/m  Physical Exam Vitals and nursing note reviewed.  Constitutional:      General: He is not in acute distress.    Appearance: Normal appearance. He is well-developed. He is not ill-appearing, toxic-appearing or diaphoretic.  HENT:     Head: Normocephalic and atraumatic.     Right Ear: External ear normal.     Left Ear: External ear normal.     Nose: Nose normal.     Mouth/Throat:     Mouth: Mucous membranes are moist.  Eyes:     Extraocular Movements: Extraocular movements intact.     Conjunctiva/sclera: Conjunctivae normal.  Cardiovascular:     Rate and Rhythm: Tachycardia present. Rhythm irregular.  Pulmonary:     Effort: Pulmonary effort is normal. No respiratory distress.     Breath sounds: Normal breath sounds. No wheezing or rales.  Chest:     Chest wall: No tenderness.  Abdominal:     General: There is no distension.     Palpations: Abdomen is  soft.     Tenderness: There is no abdominal tenderness.  Musculoskeletal:        General: No swelling. Normal range of motion.     Cervical back: Normal range of motion and neck supple. Tenderness present.     Right lower leg: No edema.     Left lower leg: No edema.  Skin:    General: Skin is warm and dry.     Coloration: Skin is not jaundiced or pale.  Neurological:     General: No focal deficit present.     Mental Status: He is alert and oriented to person, place, and time.     Cranial Nerves: No cranial nerve deficit.     Sensory: No sensory deficit.     Motor: No weakness.     Coordination: Coordination normal.  Psychiatric:        Mood and Affect: Mood normal.        Behavior: Behavior normal.        Thought Content: Thought content normal.        Judgment: Judgment normal.     ED Results /  Procedures / Treatments   Labs (all labs ordered are listed, but only abnormal results are displayed) Labs Reviewed  BASIC METABOLIC PANEL - Abnormal; Notable for the following components:      Result Value   Glucose, Bld 142 (*)    All other components within normal limits  CBC - Abnormal; Notable for the following components:   WBC 3.5 (*)    All other components within normal limits  MAGNESIUM  TSH  BRAIN NATRIURETIC PEPTIDE  PROTIME-INR  HEPARIN LEVEL (UNFRACTIONATED)  TROPONIN I (HIGH SENSITIVITY)  TROPONIN I (HIGH SENSITIVITY)    EKG EKG Interpretation Date/Time:  Sunday June 12 2023 14:50:00 EDT Ventricular Rate:  108 PR Interval:    QRS Duration:  84 QT Interval:  322 QTC Calculation: 432 R Axis:   22  Text Interpretation: Atrial fibrillation Abnormal T, consider ischemia, diffuse leads Confirmed by Gloris Manchester ([id number]) on 06/12/2023 3:25:08 PM  Radiology CT Head Wo Contrast  Result Date: 06/12/2023 CLINICAL DATA:  Head and neck trauma EXAM: CT HEAD WITHOUT CONTRAST CT CERVICAL SPINE WITHOUT CONTRAST TECHNIQUE: Multidetector CT imaging of the head and cervical spine was performed following the standard protocol without intravenous contrast. Multiplanar CT image reconstructions of the cervical spine were also generated. RADIATION DOSE REDUCTION: This exam was performed according to the departmental dose-optimization program which includes automated exposure control, adjustment of the mA and/or kV according to patient size and/or use of iterative reconstruction technique. COMPARISON:  None Available. FINDINGS: CT HEAD FINDINGS Brain: No evidence of acute infarction, hemorrhage, hydrocephalus, extra-axial collection or mass lesion/mass effect. Vascular: No hyperdense vessel or unexpected calcification. Skull: Normal. Negative for fracture or focal lesion. Sinuses/Orbits: No acute finding. Other: None. CT CERVICAL SPINE FINDINGS Alignment: Normal. Skull base and vertebrae: No acute  fracture. No primary bone lesion or focal pathologic process. Soft tissues and spinal canal: No prevertebral fluid or swelling. No visible canal hematoma. Disc levels: Anterior cervical discectomy and fusion of C5-C6. Generally mild disc space height loss and osteophytosis otherwise. Upper chest: Negative. Other: None. IMPRESSION: 1. No acute intracranial pathology. 2. No fracture or static subluxation of the cervical spine. 3. Anterior cervical discectomy and fusion of C5-C6. Electronically Signed   By: Jearld Lesch M.D.   On: 06/12/2023 16:28   CT Cervical Spine Wo Contrast  Result Date: 06/12/2023 CLINICAL DATA:  Head and  neck trauma EXAM: CT HEAD WITHOUT CONTRAST CT CERVICAL SPINE WITHOUT CONTRAST TECHNIQUE: Multidetector CT imaging of the head and cervical spine was performed following the standard protocol without intravenous contrast. Multiplanar CT image reconstructions of the cervical spine were also generated. RADIATION DOSE REDUCTION: This exam was performed according to the departmental dose-optimization program which includes automated exposure control, adjustment of the mA and/or kV according to patient size and/or use of iterative reconstruction technique. COMPARISON:  None Available. FINDINGS: CT HEAD FINDINGS Brain: No evidence of acute infarction, hemorrhage, hydrocephalus, extra-axial collection or mass lesion/mass effect. Vascular: No hyperdense vessel or unexpected calcification. Skull: Normal. Negative for fracture or focal lesion. Sinuses/Orbits: No acute finding. Other: None. CT CERVICAL SPINE FINDINGS Alignment: Normal. Skull base and vertebrae: No acute fracture. No primary bone lesion or focal pathologic process. Soft tissues and spinal canal: No prevertebral fluid or swelling. No visible canal hematoma. Disc levels: Anterior cervical discectomy and fusion of C5-C6. Generally mild disc space height loss and osteophytosis otherwise. Upper chest: Negative. Other: None. IMPRESSION: 1. No  acute intracranial pathology. 2. No fracture or static subluxation of the cervical spine. 3. Anterior cervical discectomy and fusion of C5-C6. Electronically Signed   By: Jearld Lesch M.D.   On: 06/12/2023 16:28   DG Chest Port 1 View  Result Date: 06/12/2023 CLINICAL DATA:  fall EXAM: PORTABLE CHEST 1 VIEW COMPARISON:  Chest, 09/13/2016. FINDINGS: Cardiac silhouette is within normal limits. Aortic arch calcifications. Lungs are hypoinflated. No focal consolidation or mass. No pleural effusion or pneumothorax. Cervical fusion hardware, imaged. No acute displaced fracture. IMPRESSION: 1. Hypoinflation without acute superimposed cardiopulmonary process. 2.  Aortic Atherosclerosis (ICD10-I70.0). Electronically Signed   By: Roanna Banning M.D.   On: 06/12/2023 15:20    Procedures Procedures    Medications Ordered in ED Medications  diltiazem (CARDIZEM) 1 mg/mL load via infusion 10 mg (10 mg Intravenous Bolus from Bag 06/12/23 1538)    And  diltiazem (CARDIZEM) 125 mg in dextrose 5% 125 mL (1 mg/mL) infusion (10 mg/hr Intravenous Rate/Dose Change 06/12/23 1630)  heparin ADULT infusion 100 units/mL (25000 units/263mL) (has no administration in time range)  heparin bolus via infusion 5,500 Units (has no administration in time range)  oxyCODONE-acetaminophen (PERCOCET/ROXICET) 5-325 MG per tablet 1 tablet (has no administration in time range)  acetaminophen (TYLENOL) tablet 650 mg (650 mg Oral Given 06/12/23 1543)  Tdap (BOOSTRIX) injection 0.5 mL (0.5 mLs Intramuscular Given 06/12/23 1543)    ED Course/ Medical Decision Making/ A&P                             Medical Decision Making Amount and/or Complexity of Data Reviewed Labs: ordered. Radiology: ordered.  Risk OTC drugs. Prescription drug management.   This patient presents to the ED for concern of syncope, this involves an extensive number of treatment options, and is a complaint that carries with it a high risk of complications and  morbidity.  The differential diagnosis includes arrhythmia, vasovagal episode, dehydration, anemia, metabolic derangements   Co morbidities that complicate the patient evaluation  DM, HTN, HLD   Additional history obtained:  Additional history obtained from EMS External records from outside source obtained and reviewed including EMR   Lab Tests:  I Ordered, and personally interpreted labs.  The pertinent results include: Baseline leukopenia, normal hemoglobin, normal electrolytes, normal troponin   Imaging Studies ordered:  I ordered imaging studies including chest x-ray, CT of head and cervical  spine I independently visualized and interpreted imaging which showed no acute findings I agree with the radiologist interpretation   Cardiac Monitoring: / EKG:  The patient was maintained on a cardiac monitor.  I personally viewed and interpreted the cardiac monitored which showed an underlying rhythm of: Atrial fibrillation   Consultations Obtained:  I requested consultation with the cardiology team,  and discussed lab and imaging findings as well as pertinent plan - they recommend: Will see the patient in consult   Problem List / ED Course / Critical interventions / Medication management  Patient presents for syncope and fall.  He was noted to have atrial fibrillation with RVR with EMS.  He has no history of the same.  Heart rate improved with 20 mg of Cardizem prior to arrival.  On arrival, heart rate is in the range of 100s to 110s.  Blood pressure is elevated.  Patient has no focal neurologic deficits on exam.  He does have a hematoma to his occipital scalp.  There is some very mild abrasion associated.  There is no wound that requires closure.  Patient continues to have positional dizziness, when sat up.  Given his recent head injury, this may be postconcussive.  He describes a left-sided muscular neck pain.  He has no other areas of discomfort.  Given his ongoing tachycardia,  Cardizem gtt. was initiated.  CT imaging of head and cervical spine was ordered to assess for acute injuries.  Will hold off on anticoagulation pending CT imaging.  Laboratory workup was initiated.  Tylenol was ordered for headache and neck pain.  CT imaging did not show acute findings.  Heparin was ordered.  Patient continues to have left-sided neck pain.  Percocet was ordered for ongoing analgesia.  Lab results are unremarkable.  On reassessment, patient is on 10 mg/h of diltiazem with ongoing tachycardia in the range of 110s to 120s.  I spoke with cardiology, who will see the patient in consult.  Patient was admitted to medicine for further management. I ordered medication including Tylenol and Percocet for analgesia; Tdap for tetanus prophylaxis; diltiazem for rate control; heparin for atrial fibrillation Reevaluation of the patient after these medicines showed that the patient improved I have reviewed the patients home medicines and have made adjustments as needed   Social Determinants of Health:  Has PCP  CRITICAL CARE Performed by: Gloris Manchester   Total critical care time: 35 minutes  Critical care time was exclusive of separately billable procedures and treating other patients.  Critical care was necessary to treat or prevent imminent or life-threatening deterioration.  Critical care was time spent personally by me on the following activities: development of treatment plan with patient and/or surrogate as well as nursing, discussions with consultants, evaluation of patient's response to treatment, examination of patient, obtaining history from patient or surrogate, ordering and performing treatments and interventions, ordering and review of laboratory studies, ordering and review of radiographic studies, pulse oximetry and re-evaluation of patient's condition.         Final Clinical Impression(s) / ED Diagnoses Final diagnoses:  Atrial fibrillation with RVR (HCC)  Syncope and  collapse    Rx / DC Orders ED Discharge Orders          Ordered    Amb referral to AFIB Clinic        06/12/23 1500              Gloris Manchester, MD 06/12/23 1747

## 2023-06-12 NOTE — Progress Notes (Signed)
ANTICOAGULATION CONSULT NOTE - Initial Consult  Pharmacy Consult for Heparin Indication: atrial fibrillation  No Known Allergies  Patient Measurements: Height: 6' (182.9 cm) Weight: 122.5 kg (270 lb) IBW/kg (Calculated) : 77.6 Heparin Dosing Weight: 104.6 kg  Vital Signs: Temp: 97.6 F (36.4 C) (07/07 1448) Temp Source: Oral (07/07 1448) BP: 178/105 (07/07 1600) Pulse Rate: 107 (07/07 1710)  Labs: Recent Labs    06/12/23 1526  HGB 14.2  HCT 44.0  PLT 221  LABPROT 12.9  INR 1.0  CREATININE 1.18  TROPONINIHS 9    Estimated Creatinine Clearance: 90 mL/min (by C-G formula based on SCr of 1.18 mg/dL).   Medical History: Past Medical History:  Diagnosis Date   Diabetes mellitus without complication (HCC)    Hypercholesteremia    Hypertension     Medications:  (Not in a hospital admission)  Scheduled:  Infusions:   diltiazem (CARDIZEM) infusion 10 mg/hr (06/12/23 1630)   PRN:   Assessment: 61 yom with a history of DM, HTN, HLD. Patient is presenting via EMS with AF. Heparin per pharmacy consult placed for atrial fibrillation.  Patient is not on anticoagulation prior to arrival.  Hgb 14.2; plt 221  Goal of Therapy:  Heparin level 0.3-0.7 units/ml Monitor platelets by anticoagulation protocol: Yes   Plan:  Give IV heparin 5500 units bolus x 1 Start heparin infusion at 1500 units/hr Check anti-Xa level in 6 hours and daily while on heparin Continue to monitor H&H and platelets  Delmar Landau, PharmD, BCPS 06/12/2023 5:25 PM ED Clinical Pharmacist -  [phone]

## 2023-06-12 NOTE — Consult Note (Addendum)
Cardiology Consultation:   Patient ID: CYAN DRAKOS MRN: [id number]; DOB: 06/21/1961  Admit date: 06/12/2023 Date of Consult: 06/12/2023  Primary Care Provider: Patient, No Pcp Per Primary Cardiologist: None  Primary Electrophysiologist:  None    Patient Profile:   MASOUD SCHMIDTKE is a 62 y.o. male with a hx of HTN, HLD, T2DM who is being seen today for the evaluation of atrial fibrillation at the request of Dr. Durwin Nora.  History of Present Illness:   HAKEEM URSIN is a 62 y.o. male with a hx of HTN, HLD, T2DM who is being seen today for the evaluation of atrial fibrillation at the request of Dr. Durwin Nora.  Patient reports he was walking to his car today when he started to feel dizzy and abruptly passed out.  He denies urinary incontinence, tongue biting, recent sickness, chest pain.  EMS was called and he was found to be tachycardic with a heart rate in the 140s with an irregularly irregular rhythm.  He was given Cardizem 20 mg with improvement.  Patient reports he has no history of atrial fibrillation.  He is unsure when he entered into atrial fibrillation but notes that he became dizzy around noon on July 7 prior to passing out.  Other than this episode of atrial fibrillation, he denies any history of heart attack, heart surgery, heart disease.  He has a history of hypertension, hyperlipidemia, and type 2 diabetes.  However he reports he only takes Trulicity at home and does not take any other medications.  He does not have a primary cardiologist.  He does not take anticoagulation at home. He has never had a prior ECHO. He denies tobacco use.   In the ER, he continues to be in atrial fibrillation.  CT head was negative for acute intracranial process.  He was started on a diltiazem drip at 10 mg and a heparin drip after his CT head was clear.  While on diltiazem 10 mg/h, his blood pressure is 157/88 and his heart rate is 80s to 100s.  On my examination, patient feels well, continues to deny chest  pain or dizziness. CBC, CMP, TSH and glucose are wnl. EKG shows atrial fibrillation. Troponins we negative. CT angiogram is negative for PE.  Past Medical History:  Diagnosis Date   Diabetes mellitus without complication (HCC)    Hypercholesteremia    Hypertension     Past Surgical History:  Procedure Laterality Date   SPINAL FUSION       Home Medications:  Prior to Admission medications   Medication Sig Start Date End Date Taking? Authorizing Provider  Dulaglutide (TRULICITY) 3 MG/0.5ML SOPN Inject 3 mg into the skin every 7 (seven) days. On Sunday of each week   Yes [provider]  amLODipine-valsartan (EXFORGE) 10-160 MG tablet Take 1 tablet by mouth daily. Patient not taking: Reported on 06/12/2023    [provider]  chlorthalidone (HYGROTON) 25 MG tablet Take 25 mg by mouth daily. Patient not taking: Reported on 06/12/2023    [provider]  JARDIANCE 10 MG TABS tablet Take 10 mg by mouth daily. Patient not taking: Reported on 06/12/2023 09/06/16   [provider]  metFORMIN (GLUCOPHAGE-XR) 750 MG 24 hr tablet Take 750 mg by mouth 2 (two) times daily. Patient not taking: Reported on 06/12/2023    [provider]  rosuvastatin (CRESTOR) 20 MG tablet Take 20 mg by mouth daily. Patient not taking: Reported on 06/12/2023    [provider]  Inpatient Medications: Scheduled Meds:  [START ON 06/13/2023] insulin aspart  0-15 Units Subcutaneous TID WC   rosuvastatin  20 mg Oral Daily   Continuous Infusions:  diltiazem (CARDIZEM) infusion 10 mg/hr (06/12/23 2019)   heparin 1,500 Units/hr (06/12/23 1903)   PRN Meds: acetaminophen, labetalol, ondansetron (ZOFRAN) IV, oxyCODONE  Allergies:   No Known Allergies  Social History:   Social History   Socioeconomic History   Marital status: Married    Spouse name: Not on file   Number of children: Not on file   Years of education: Not on file   Highest education level: Not on file   Occupational History   Not on file  Tobacco Use   Smoking status: Never   Smokeless tobacco: Not on file  Substance and Sexual Activity   Alcohol use: Yes   Drug use: No   Sexual activity: Not on file  Other Topics Concern   Not on file  Social History Narrative   Not on file   Social Determinants of Health   Financial Resource Strain: Not on file  Food Insecurity: Not on file  Transportation Needs: Not on file  Physical Activity: Not on file  Stress: Not on file  Social Connections: Not on file  Intimate Partner Violence: Not on file    Family History:   Family History  Problem Relation Age of Onset   Coronary artery disease Father      Review of Systems:  Negative except what is documented in HPI. Physical Exam/Data:   Vitals:   06/12/23 1930 06/12/23 2007 06/12/23 2019 06/12/23 2030  BP:   (!) 160/86 (!) 157/88  Pulse: (!) 106 84 80 83  Resp: 16 18 19 19   Temp:      TempSrc:      SpO2: 97% 97% 97% 99%  Weight:      Height:       No intake or output data in the 24 hours ending 06/12/23 2048 Filed Weights   06/12/23 1453  Weight: 122.5 kg   Body mass index is 36.62 kg/m.  General:  Well nourished, well developed, in no acute distress HEENT: normal Lymph: no adenopathy Neck: no JVD Endocrine:  No thryomegaly Vascular: No carotid bruits; FA pulses 2+ bilaterally without bruits  Cardiac:  irregularly irregular rhythm Lungs:  clear to auscultation bilaterally, no wheezing, rhonchi or rales  Abd: soft, nontender, no hepatomegaly  Ext: no edema Musculoskeletal:  No deformities, BUE and BLE strength normal and equal Skin: warm and dry  Neuro:  CNs 2-12 intact, no focal abnormalities noted Psych:  Normal affect   EKG:  The EKG was personally reviewed and demonstrates:  atrial fibrillation Telemetry:  Telemetry was personally reviewed and demonstrates:  atrial fibrillation  Relevant CV Studies: None ECHO: pending  Laboratory  Data:  Chemistry Recent Labs  Lab 06/12/23 1526  NA 136  K 4.0  CL 103  CO2 23  GLUCOSE 142*  BUN 10  CREATININE 1.18  CALCIUM 9.3  GFRNONAA >60  ANIONGAP 10    No results for input(s): "PROT", "ALBUMIN", "AST", "ALT", "ALKPHOS", "BILITOT" in the last 168 hours. Hematology Recent Labs  Lab 06/12/23 1526  WBC 3.5*  RBC 5.19  HGB 14.2  HCT 44.0  MCV 84.8  MCH 27.4  MCHC 32.3  RDW 14.1  PLT 221   Cardiac EnzymesNo results for input(s): "TROPONINI" in the last 168 hours. No results for input(s): "TROPIPOC" in the last 168 hours.  BNP Recent Labs  Lab 06/12/23 1526  BNP 43.2    DDimer No results for input(s): "DDIMER" in the last 168 hours.  Radiology/Studies:  CT Angio Chest Pulmonary Embolism (PE) W or WO Contrast  Result Date: 06/12/2023 CLINICAL DATA:  Syncope EXAM: CT ANGIOGRAPHY CHEST WITH CONTRAST TECHNIQUE: Multidetector CT imaging of the chest was performed using the standard protocol during bolus administration of intravenous contrast. Multiplanar CT image reconstructions and MIPs were obtained to evaluate the vascular anatomy. RADIATION DOSE REDUCTION: This exam was performed according to the departmental dose-optimization program which includes automated exposure control, adjustment of the mA and/or kV according to patient size and/or use of iterative reconstruction technique. CONTRAST:  75mL OMNIPAQUE IOHEXOL 350 MG/ML SOLN COMPARISON:  None Available. FINDINGS: Cardiovascular: No evidence of pulmonary embolus. Evaluation the subsegmental pulmonary arteries is somewhat limited due to motion artifact. Normal heart size. Trace pericardial effusion. Normal caliber thoracic aorta with mild atherosclerotic disease. Mild coronary artery calcifications. Mediastinum/Nodes: Esophagus and thyroid are unremarkable. No enlarged lymph nodes seen in the chest. Lungs/Pleura: Central airways are patent. Mild bilateral ground-glass opacities which are likely due to atelectasis given  expiratory phase of imaging. No consolidation, pleural effusion or pneumothorax. Upper Abdomen: No acute abnormality. Musculoskeletal: No chest wall abnormality. No acute or significant osseous findings. Review of the MIP images confirms the above findings. IMPRESSION: 1. No evidence of pulmonary embolus. Evaluation of the subsegmental pulmonary arteries is somewhat limited due to motion artifact. 2. Mild bilateral ground-glass opacities which are likely due to atelectasis. 3. Aortic Atherosclerosis (ICD10-I70.0). Electronically Signed   By: Allegra Lai M.D.   On: 06/12/2023 20:01   CT Head Wo Contrast  Result Date: 06/12/2023 CLINICAL DATA:  Head and neck trauma EXAM: CT HEAD WITHOUT CONTRAST CT CERVICAL SPINE WITHOUT CONTRAST TECHNIQUE: Multidetector CT imaging of the head and cervical spine was performed following the standard protocol without intravenous contrast. Multiplanar CT image reconstructions of the cervical spine were also generated. RADIATION DOSE REDUCTION: This exam was performed according to the departmental dose-optimization program which includes automated exposure control, adjustment of the mA and/or kV according to patient size and/or use of iterative reconstruction technique. COMPARISON:  None Available. FINDINGS: CT HEAD FINDINGS Brain: No evidence of acute infarction, hemorrhage, hydrocephalus, extra-axial collection or mass lesion/mass effect. Vascular: No hyperdense vessel or unexpected calcification. Skull: Normal. Negative for fracture or focal lesion. Sinuses/Orbits: No acute finding. Other: None. CT CERVICAL SPINE FINDINGS Alignment: Normal. Skull base and vertebrae: No acute fracture. No primary bone lesion or focal pathologic process. Soft tissues and spinal canal: No prevertebral fluid or swelling. No visible canal hematoma. Disc levels: Anterior cervical discectomy and fusion of C5-C6. Generally mild disc space height loss and osteophytosis otherwise. Upper chest: Negative.  Other: None. IMPRESSION: 1. No acute intracranial pathology. 2. No fracture or static subluxation of the cervical spine. 3. Anterior cervical discectomy and fusion of C5-C6. Electronically Signed   By: Jearld Lesch M.D.   On: 06/12/2023 16:28   CT Cervical Spine Wo Contrast  Result Date: 06/12/2023 CLINICAL DATA:  Head and neck trauma EXAM: CT HEAD WITHOUT CONTRAST CT CERVICAL SPINE WITHOUT CONTRAST TECHNIQUE: Multidetector CT imaging of the head and cervical spine was performed following the standard protocol without intravenous contrast. Multiplanar CT image reconstructions of the cervical spine were also generated. RADIATION DOSE REDUCTION: This exam was performed according to the departmental dose-optimization program which includes automated exposure control, adjustment of the mA and/or kV according to patient size and/or use of iterative reconstruction technique.  COMPARISON:  None Available. FINDINGS: CT HEAD FINDINGS Brain: No evidence of acute infarction, hemorrhage, hydrocephalus, extra-axial collection or mass lesion/mass effect. Vascular: No hyperdense vessel or unexpected calcification. Skull: Normal. Negative for fracture or focal lesion. Sinuses/Orbits: No acute finding. Other: None. CT CERVICAL SPINE FINDINGS Alignment: Normal. Skull base and vertebrae: No acute fracture. No primary bone lesion or focal pathologic process. Soft tissues and spinal canal: No prevertebral fluid or swelling. No visible canal hematoma. Disc levels: Anterior cervical discectomy and fusion of C5-C6. Generally mild disc space height loss and osteophytosis otherwise. Upper chest: Negative. Other: None. IMPRESSION: 1. No acute intracranial pathology. 2. No fracture or static subluxation of the cervical spine. 3. Anterior cervical discectomy and fusion of C5-C6. Electronically Signed   By: Jearld Lesch M.D.   On: 06/12/2023 16:28   DG Chest Port 1 View  Result Date: 06/12/2023 CLINICAL DATA:  fall EXAM: PORTABLE CHEST 1  VIEW COMPARISON:  Chest, 09/13/2016. FINDINGS: Cardiac silhouette is within normal limits. Aortic arch calcifications. Lungs are hypoinflated. No focal consolidation or mass. No pleural effusion or pneumothorax. Cervical fusion hardware, imaged. No acute displaced fracture. IMPRESSION: 1. Hypoinflation without acute superimposed cardiopulmonary process. 2.  Aortic Atherosclerosis (ICD10-I70.0). Electronically Signed   By: Roanna Banning M.D.   On: 06/12/2023 15:20    Assessment and Plan:  BOSTIN VITIELLO is a 62 y.o. male with a hx of HTN, HLD, T2DM who is being seen today for the evaluation of atrial fibrillation at the request of Dr. Durwin Nora.  #. New onset atrial fibrillation Etiology is unclear at this time - differential includes recent sickness (patient denies), thyroid dysfunction (TSH normal), electrolyte abnormalities (CMP wnl), CHF (ECHO pending). CHADSVASC is 2 (HTN, DM). IT is unclear when rhythm started, so will empirically start anticoagulation and not cardiovert at this time. - ECHO to assess LVEF - Agree with starting heparin drip. Can transition to PO DOAC in AM (apixaban 5 mg BID) - Agree with Diltiazem drip (currently at 10 mg/hr). Can transition to PO beta blocker or PO diltiazem in AM depending on ECHO - Based on ECHO can discuss inpatient TEE/CV versus 1 month of AC and outpatient cardioversion without TEE.  - maintain telemetry - Cardiology will continue to follow    #. T2DM - f/u A1c - agree with insulin per primary team  #. Hypertension BP currently 150s/80s. As an outpatient patient would benefit from better BP control - Could consider starting ACEi given renal protection in the setting of T2DM - Of note, patient is on amlodipine outpatient but does not take, so starting medications will require a discussion about the importance of BP control  #. HLD Wife reports pt has HLD. Cholesterol labs not visible here - can obtain cholesterol labs as outpatient, and would likely  benefit from statin  For questions or updates, please contact [facility] Please consult [email or web address] for contact info under   Willette Alma, MD MPH [facility] 06/12/2023 8:48 PM

## 2023-06-12 NOTE — ED Triage Notes (Signed)
Pt BIB EMS for afib with RVR. Pt was eating at restaurant got up to leave and had a syncopal episode, fell and hit head on curb. Small laceration on back of head. Denies CP, SHOB. Initial HR 140s-190s. EMS administered 20mg  IV cardizem with response, HR decreased to 80s-130s. CBG 232. 20G L AC.

## 2023-06-13 ENCOUNTER — Observation Stay (HOSPITAL_BASED_OUTPATIENT_CLINIC_OR_DEPARTMENT_OTHER): Payer: BLUE CROSS/BLUE SHIELD

## 2023-06-13 DIAGNOSIS — R55 Syncope and collapse: Secondary | ICD-10-CM | POA: Diagnosis not present

## 2023-06-13 DIAGNOSIS — I10 Essential (primary) hypertension: Secondary | ICD-10-CM | POA: Diagnosis not present

## 2023-06-13 DIAGNOSIS — I4891 Unspecified atrial fibrillation: Secondary | ICD-10-CM | POA: Diagnosis not present

## 2023-06-13 DIAGNOSIS — E119 Type 2 diabetes mellitus without complications: Secondary | ICD-10-CM | POA: Diagnosis not present

## 2023-06-13 LAB — BASIC METABOLIC PANEL
Anion gap: 9 (ref 5–15)
BUN: 9 mg/dL (ref 6–20)
CO2: 23 mmol/L (ref 22–32)
Calcium: 8.4 mg/dL — ABNORMAL LOW (ref 8.9–10.3)
Chloride: 105 mmol/L (ref 98–111)
Creatinine, Ser: 1.02 mg/dL (ref 0.61–1.24)
GFR, Estimated: >60 mL/min (ref >60)
Glucose, Bld: 142 mg/dL — ABNORMAL HIGH (ref 70–99)
Potassium: 3.8 mmol/L (ref 3.5–5.1)
Sodium: 137 mmol/L (ref 135–145)

## 2023-06-13 LAB — CBC
HCT: 42.4 % (ref 39.0–52.0)
Hemoglobin: 14 g/dL (ref 13.0–17.0)
MCH: 28.3 pg (ref 26.0–34.0)
MCHC: 33 g/dL (ref 30.0–36.0)
MCV: 85.7 fL (ref 80.0–100.0)
Platelets: 243 10*3/uL (ref 150–400)
RBC: 4.95 MIL/uL (ref 4.22–5.81)
RDW: 14.5 % (ref 11.5–15.5)
WBC: 4.7 10*3/uL (ref 4.0–10.5)
nRBC: 0 % (ref 0.0–0.2)

## 2023-06-13 LAB — LIPID PANEL
Cholesterol: 207 mg/dL — ABNORMAL HIGH (ref 0–200)
HDL: 37 mg/dL — ABNORMAL LOW (ref >40)
LDL Cholesterol: 130 mg/dL — ABNORMAL HIGH (ref 0–99)
Total CHOL/HDL Ratio: 5.6 RATIO
Triglycerides: 201 mg/dL — ABNORMAL HIGH (ref <150)
VLDL: 40 mg/dL (ref 0–40)

## 2023-06-13 LAB — HEPARIN LEVEL (UNFRACTIONATED)
Heparin Unfractionated: 0.71 IU/mL — ABNORMAL HIGH (ref 0.30–0.70)
Heparin Unfractionated: 0.88 IU/mL — ABNORMAL HIGH (ref 0.30–0.70)

## 2023-06-13 LAB — ECHOCARDIOGRAM COMPLETE
Area-P 1/2: 3.61 cm2
Height: 72 in
S' Lateral: 2.7 cm
Weight: 4320 oz

## 2023-06-13 LAB — CBG MONITORING, ED
Glucose-Capillary: 155 mg/dL — ABNORMAL HIGH (ref 70–99)
Glucose-Capillary: 128 mg/dL — ABNORMAL HIGH (ref 70–99)

## 2023-06-13 LAB — GLUCOSE, CAPILLARY
Glucose-Capillary: 105 mg/dL — ABNORMAL HIGH (ref 70–99)
Glucose-Capillary: 131 mg/dL — ABNORMAL HIGH (ref 70–99)

## 2023-06-13 LAB — MAGNESIUM: Magnesium: 1.9 mg/dL (ref 1.7–2.4)

## 2023-06-13 LAB — HIV ANTIBODY (ROUTINE TESTING W REFLEX): HIV Screen 4th Generation wRfx: NONREACTIVE

## 2023-06-13 MED ORDER — MAGNESIUM SULFATE 2 GM/50ML IV SOLN
2.0000 g | Freq: Once | INTRAVENOUS | Status: AC
  Administered 2023-06-13: 2 g via INTRAVENOUS
  Filled 2023-06-13: qty 50

## 2023-06-13 MED ORDER — METOPROLOL TARTRATE 25 MG PO TABS
25.0000 mg | ORAL_TABLET | Freq: Two times a day (BID) | ORAL | Status: DC
  Administered 2023-06-13 – 2023-06-14 (×3): 25 mg via ORAL
  Filled 2023-06-13 (×3): qty 1

## 2023-06-13 MED ORDER — POTASSIUM CHLORIDE CRYS ER 20 MEQ PO TBCR
40.0000 meq | EXTENDED_RELEASE_TABLET | Freq: Once | ORAL | Status: AC
  Administered 2023-06-13: 40 meq via ORAL
  Filled 2023-06-13: qty 2

## 2023-06-13 MED ORDER — PERFLUTREN LIPID MICROSPHERE
1.0000 mL | INTRAVENOUS | Status: AC | PRN
  Administered 2023-06-13: 2 mL via INTRAVENOUS

## 2023-06-13 MED ORDER — APIXABAN 5 MG PO TABS
5.0000 mg | ORAL_TABLET | Freq: Two times a day (BID) | ORAL | Status: DC
  Administered 2023-06-13 – 2023-06-14 (×3): 5 mg via ORAL
  Filled 2023-06-13 (×3): qty 1

## 2023-06-13 MED ORDER — MAGNESIUM SULFATE 2 GM/50ML IV SOLN
INTRAVENOUS | Status: AC
  Filled 2023-06-13: qty 50

## 2023-06-13 NOTE — ED Notes (Addendum)
Patient transported to vascular. 

## 2023-06-13 NOTE — Progress Notes (Addendum)
[facility] PROGRESS NOTE   Travis Harmon ZOX:[id number] DOB: [birth year] DOA: 06/12/2023  PCP: Patient, No Pcp Per  Brief History/Interval Summary: 62 y.o. male with a past medical history of essential hypertension and diabetes mellitus type 2 and hyperlipidemia who presented after he sustained a syncopal episode.  He was noted to be in atrial fibrillation with RVR.  He was hospitalized for further management.    Consultants: Cardiology  Procedures: Echocardiogram is pending    Subjective/Interval History: Patient complains of dizziness and lightheadedness this morning.  Denies any chest pain shortness of breath.  Still feels very fatigued.  No nausea or vomiting.  Complains of headache.    Assessment/Plan:  Atrial fibrillation with RVR TSH was normal.  Echocardiogram is pending.  CT angiogram chest was negative for PE. Patient was placed on diltiazem infusion.  He has converted to sinus rhythm. Cardiology was consulted by EDP last night.  Await their input this morning. Patient is on IV heparin. Await echocardiogram for EF. Anticipate patient will be transition to apixaban today by cardiology. Continues to have dizziness and lightheadedness which could be from trauma to his head.  He might have had a concussion.  CT head did not show any acute findings.  Pain medications were provided. Supplement potassium and magnesium.  Syncope Most likely secondary to atrial fibrillation.  No PE on CT angiogram.  Echocardiogram is pending.  Mobilize with physical therapy.  Essential hypertension Holding his antihypertensives.  Monitor blood pressures.  Diabetes mellitus type 2 HbA1c is pending.  CBGs being monitored.  SSI.  Hyperlipidemia LDL is 130.  Patient noted to be on rosuvastatin prior to admission.  Unclear if he was being compliant with this medication.  Rosuvastatin is being continued.  May need to increase the dose.  Obesity Estimated body mass index is 36.62  kg/m as calculated from the following:   Height as of this encounter: 6' (1.829 m).   Weight as of this encounter: 122.5 kg.   DVT Prophylaxis: On IV heparin Code Status: Full code Family Communication: Discussed with patient and his wife Disposition Plan: Anticipate discharge home in 24 to 48 hours  Status is: Observation The patient will require care spanning > 2 midnights and should be moved to inpatient because: Persistent dizziness and lightheadedness in the setting of syncope and atrial fibrillation      Medications: Scheduled:  insulin aspart  0-15 Units Subcutaneous TID WC   rosuvastatin  20 mg Oral Daily   Continuous:  diltiazem (CARDIZEM) infusion 5 mg/hr (06/13/23 0348)   heparin 1,400 Units/hr (06/13/23 0053)   WJX:BJYNWGNFAOZHY, labetalol, ondansetron (ZOFRAN) IV, oxyCODONE  Antibiotics: Anti-infectives (From admission, onward)    None       Objective:  Vital Signs  Vitals:   06/13/23 0750 06/13/23 0800 06/13/23 0815 06/13/23 0837  BP: (!) 152/76 (!) 142/99 (!) 166/97   Pulse: 70 87 79   Resp: 10 15 14    Temp:    97.8 F (36.6 C)  TempSrc:    Oral  SpO2: 100% 100% 99%   Weight:      Height:       No intake or output data in the 24 hours ending 06/13/23 0859 Filed Weights   06/12/23 1453  Weight: 122.5 kg    General appearance: Awake alert.  In no distress Resp: Clear to auscultation bilaterally.  Normal effort Cardio: S1-S2 is normal regular.  No S3-S4.  No rubs murmurs or bruit GI: Abdomen is soft.  Nontender  nondistended.  Bowel sounds are present normal.  No masses organomegaly Extremities: No edema.  Full range of motion of lower extremities. Neurologic: Alert and oriented x3.  No focal neurological deficits.    Lab Results:  Data Reviewed: I have personally reviewed following labs and reports of the imaging studies  CBC: Recent Labs  Lab 06/12/23 1526 06/13/23 0500  WBC 3.5* 4.7  HGB 14.2 14.0  HCT 44.0 42.4  MCV 84.8  85.7  PLT 221 243    Basic Metabolic Panel: Recent Labs  Lab 06/12/23 1526 06/13/23 0624  NA 136 137  K 4.0 3.8  CL 103 105  CO2 23 23  GLUCOSE 142* 142*  BUN 10 9  CREATININE 1.18 1.02  CALCIUM 9.3 8.4*  MG 2.0 1.9    GFR: Estimated Creatinine Clearance: 104.1 mL/min (by C-G formula based on SCr of 1.02 mg/dL).  Liver Function Tests: No results for input(s): "AST", "ALT", "ALKPHOS", "BILITOT", "PROT", "ALBUMIN" in the last 168 hours.   Coagulation Profile: Recent Labs  Lab 06/12/23 1526  INR 1.0     CBG: Recent Labs  Lab 06/12/23 2157 06/13/23 0825  GLUCAP 154* 155*    Lipid Profile: Recent Labs    06/13/23 0500  CHOL 207*  HDL 37*  LDLCALC 130*  TRIG 201*  CHOLHDL 5.6    Thyroid Function Tests: Recent Labs    06/12/23 1526  TSH 0.587     Radiology Studies: CT Angio Chest Pulmonary Embolism (PE) W or WO Contrast  Result Date: 06/12/2023 CLINICAL DATA:  Syncope EXAM: CT ANGIOGRAPHY CHEST WITH CONTRAST TECHNIQUE: Multidetector CT imaging of the chest was performed using the standard protocol during bolus administration of intravenous contrast. Multiplanar CT image reconstructions and MIPs were obtained to evaluate the vascular anatomy. RADIATION DOSE REDUCTION: This exam was performed according to the departmental dose-optimization program which includes automated exposure control, adjustment of the mA and/or kV according to patient size and/or use of iterative reconstruction technique. CONTRAST:  75mL OMNIPAQUE IOHEXOL 350 MG/ML SOLN COMPARISON:  None Available. FINDINGS: Cardiovascular: No evidence of pulmonary embolus. Evaluation the subsegmental pulmonary arteries is somewhat limited due to motion artifact. Normal heart size. Trace pericardial effusion. Normal caliber thoracic aorta with mild atherosclerotic disease. Mild coronary artery calcifications. Mediastinum/Nodes: Esophagus and thyroid are unremarkable. No enlarged lymph nodes seen in the  chest. Lungs/Pleura: Central airways are patent. Mild bilateral ground-glass opacities which are likely due to atelectasis given expiratory phase of imaging. No consolidation, pleural effusion or pneumothorax. Upper Abdomen: No acute abnormality. Musculoskeletal: No chest wall abnormality. No acute or significant osseous findings. Review of the MIP images confirms the above findings. IMPRESSION: 1. No evidence of pulmonary embolus. Evaluation of the subsegmental pulmonary arteries is somewhat limited due to motion artifact. 2. Mild bilateral ground-glass opacities which are likely due to atelectasis. 3. Aortic Atherosclerosis (ICD10-I70.0). Electronically Signed   By: Allegra Lai M.D.   On: 06/12/2023 20:01   CT Head Wo Contrast  Result Date: 06/12/2023 CLINICAL DATA:  Head and neck trauma EXAM: CT HEAD WITHOUT CONTRAST CT CERVICAL SPINE WITHOUT CONTRAST TECHNIQUE: Multidetector CT imaging of the head and cervical spine was performed following the standard protocol without intravenous contrast. Multiplanar CT image reconstructions of the cervical spine were also generated. RADIATION DOSE REDUCTION: This exam was performed according to the departmental dose-optimization program which includes automated exposure control, adjustment of the mA and/or kV according to patient size and/or use of iterative reconstruction technique. COMPARISON:  None Available. FINDINGS: CT  HEAD FINDINGS Brain: No evidence of acute infarction, hemorrhage, hydrocephalus, extra-axial collection or mass lesion/mass effect. Vascular: No hyperdense vessel or unexpected calcification. Skull: Normal. Negative for fracture or focal lesion. Sinuses/Orbits: No acute finding. Other: None. CT CERVICAL SPINE FINDINGS Alignment: Normal. Skull base and vertebrae: No acute fracture. No primary bone lesion or focal pathologic process. Soft tissues and spinal canal: No prevertebral fluid or swelling. No visible canal hematoma. Disc levels: Anterior  cervical discectomy and fusion of C5-C6. Generally mild disc space height loss and osteophytosis otherwise. Upper chest: Negative. Other: None. IMPRESSION: 1. No acute intracranial pathology. 2. No fracture or static subluxation of the cervical spine. 3. Anterior cervical discectomy and fusion of C5-C6. Electronically Signed   By: Jearld Lesch M.D.   On: 06/12/2023 16:28   CT Cervical Spine Wo Contrast  Result Date: 06/12/2023 CLINICAL DATA:  Head and neck trauma EXAM: CT HEAD WITHOUT CONTRAST CT CERVICAL SPINE WITHOUT CONTRAST TECHNIQUE: Multidetector CT imaging of the head and cervical spine was performed following the standard protocol without intravenous contrast. Multiplanar CT image reconstructions of the cervical spine were also generated. RADIATION DOSE REDUCTION: This exam was performed according to the departmental dose-optimization program which includes automated exposure control, adjustment of the mA and/or kV according to patient size and/or use of iterative reconstruction technique. COMPARISON:  None Available. FINDINGS: CT HEAD FINDINGS Brain: No evidence of acute infarction, hemorrhage, hydrocephalus, extra-axial collection or mass lesion/mass effect. Vascular: No hyperdense vessel or unexpected calcification. Skull: Normal. Negative for fracture or focal lesion. Sinuses/Orbits: No acute finding. Other: None. CT CERVICAL SPINE FINDINGS Alignment: Normal. Skull base and vertebrae: No acute fracture. No primary bone lesion or focal pathologic process. Soft tissues and spinal canal: No prevertebral fluid or swelling. No visible canal hematoma. Disc levels: Anterior cervical discectomy and fusion of C5-C6. Generally mild disc space height loss and osteophytosis otherwise. Upper chest: Negative. Other: None. IMPRESSION: 1. No acute intracranial pathology. 2. No fracture or static subluxation of the cervical spine. 3. Anterior cervical discectomy and fusion of C5-C6. Electronically Signed   By: Jearld Lesch M.D.   On: 06/12/2023 16:28   DG Chest Port 1 View  Result Date: 06/12/2023 CLINICAL DATA:  fall EXAM: PORTABLE CHEST 1 VIEW COMPARISON:  Chest, 09/13/2016. FINDINGS: Cardiac silhouette is within normal limits. Aortic arch calcifications. Lungs are hypoinflated. No focal consolidation or mass. No pleural effusion or pneumothorax. Cervical fusion hardware, imaged. No acute displaced fracture. IMPRESSION: 1. Hypoinflation without acute superimposed cardiopulmonary process. 2.  Aortic Atherosclerosis (ICD10-I70.0). Electronically Signed   By: Roanna Banning M.D.   On: 06/12/2023 15:20       LOS: 0 days   Rogena Deupree Rito Ehrlich  [facility] Pager on [email or web address]  06/13/2023, 8:59 AM

## 2023-06-13 NOTE — Progress Notes (Signed)
ANTICOAGULATION CONSULT NOTE - Follow Up Consult  Pharmacy Consult for heparin Indication: atrial fibrillation  Labs: Recent Labs    06/12/23 1526 06/12/23 1715 06/13/23 0003  HGB 14.2  --   --   HCT 44.0  --   --   PLT 221  --   --   LABPROT 12.9  --   --   INR 1.0  --   --   HEPARINUNFRC  --   --  0.88*  CREATININE 1.18  --   --   TROPONINIHS 9 17  --     Assessment: 61yo male supratherapeutic on heparin with initial dosing for new-onset Afib; no infusion issues or signs of bleeding per RN.  Goal of Therapy:  Heparin level 0.3-0.7 units/ml   Plan:  Decrease heparin infusion by 1 unit/kg/hr to 1400 units/hr. Check level in 6 hours.   Vernard Gambles, PharmD, BCPS 06/13/2023 12:48 AM

## 2023-06-13 NOTE — Evaluation (Signed)
Physical Therapy Evaluation Patient Details Name: Travis Harmon MRN: [id number] DOB: 1961-04-09 Today's Date: 06/13/2023  History of Present Illness  Pt is a 62 y/o M admitted on 06/12/23 after presenting with c/o syncopal episode & striking his head. Pt noted to be in A-fib with RVR. Head & cervical spine CT negative for acute trauma. PMH: HTN, DM2, HLD  Clinical Impression  Pt seen for PT evaluation with pt agreeable, daughter present in room. Pt reports prior to admission he was independent without AD, driving, living with his wife in a 1 level home with 3 steps with L rail to enter. On this date, pt is able to complete bed mobility with mod I, STS with min assist fade to CGA, and short distance gait in room without AD with CGA<>min assist.  Pt c/o "wooziness/lightheadedness" with standing activities & pt + for orthostatic hypotension. Encouraged pt to increase fluid intake & sit in recliner as tolerated. Notified nurse & MD of pt's BP. Will continue to follow pt acutely to progress mobility as able but anticipate pt will recover well as he continues to mobilize.  HR 93-95bpm throughout session BP checked in LUE  BP  Sitting 186/108 mmHg MAP 131  Standing at 0 165/105 mmHg MAP 123  Standing at 3 minutes 151/105 mmHg MAP 120         Assistance Recommended at Discharge PRN  If plan is discharge home, recommend the following:  Can travel by private vehicle  A little help with walking and/or transfers;A little help with bathing/dressing/bathroom;Assistance with cooking/housework;Assist for transportation;Help with stairs or ramp for entrance        Equipment Recommendations None recommended by PT  Recommendations for Other Services       Functional Status Assessment Patient has had a recent decline in their functional status and demonstrates the ability to make significant improvements in function in a reasonable and predictable amount of time.     Precautions / Restrictions  Precautions Precautions: Fall Restrictions Weight Bearing Restrictions: No      Mobility  Bed Mobility Overal bed mobility: Modified Independent             General bed mobility comments: supine>sit with HOB elevated, use of bed rails    Transfers Overall transfer level: Needs assistance Equipment used: None Transfers: Sit to/from Stand Sit to Stand: Min assist, Min guard           General transfer comment: min assist fade to CGA, STS from EOB without AD    Ambulation/Gait Ambulation/Gait assistance: Min assist, Min guard Gait Distance (Feet): 10 Feet (+ 10 ft) Assistive device: None Gait Pattern/deviations: Decreased step length - left, Decreased step length - right, Decreased stride length Gait velocity: decreased     General Gait Details: guarded, distance limited by "wooziness/lightheadedness" with mobility  Stairs            Wheelchair Mobility     Tilt Bed    Modified Rankin (Stroke Patients Only)       Balance Overall balance assessment: Needs assistance   Sitting balance-Leahy Scale: Good     Standing balance support: During functional activity, No upper extremity supported Standing balance-Leahy Scale: Fair                               Pertinent Vitals/Pain Pain Assessment Pain Assessment: No/denies pain    Home Living Family/patient expects to be discharged to:: Private residence Living  Arrangements: Spouse/significant other Available Help at Discharge: Family Type of Home: House Home Access: Stairs to enter Entrance Stairs-Rails: Left Entrance Stairs-Number of Steps: 3   Home Layout: One level        Prior Function Prior Level of Function : Independent/Modified Independent;Driving             Mobility Comments: denies falls       Hand Dominance        Extremity/Trunk Assessment   Upper Extremity Assessment Upper Extremity Assessment: Overall WFL for tasks assessed    Lower Extremity  Assessment Lower Extremity Assessment: Generalized weakness    Cervical / Trunk Assessment Cervical / Trunk Assessment: Normal  Communication   Communication: No difficulties  Cognition Arousal/Alertness: Awake/alert Behavior During Therapy: WFL for tasks assessed/performed Overall Cognitive Status: Within Functional Limits for tasks assessed                                          General Comments      Exercises     Assessment/Plan    PT Assessment Patient needs continued PT services  PT Problem List Decreased strength;Decreased activity tolerance;Decreased balance;Decreased mobility       PT Treatment Interventions DME instruction;Therapeutic exercise;Balance training;Gait training;Stair training;Neuromuscular re-education;Functional mobility training;Therapeutic activities;Patient/family education    PT Goals (Current goals can be found in the Care Plan section)  Acute Rehab PT Goals Patient Stated Goal: get better PT Goal Formulation: With patient Time For Goal Achievement: 06/27/23 Potential to Achieve Goals: Good    Frequency Min 1X/week     Co-evaluation               AM-PAC PT "6 Clicks" Mobility  Outcome Measure Help needed turning from your back to your side while in a flat bed without using bedrails?: None Help needed moving from lying on your back to sitting on the side of a flat bed without using bedrails?: None Help needed moving to and from a bed to a chair (including a wheelchair)?: A Little Help needed standing up from a chair using your arms (e.g., wheelchair or bedside chair)?: A Little Help needed to walk in hospital room?: A Little Help needed climbing 3-5 steps with a railing? : A Little 6 Click Score: 20    End of Session Equipment Utilized During Treatment: Gait belt Activity Tolerance: Patient tolerated treatment well (limited by "wooziness/lightheadedness" & + for orthostatic hypotension) Patient left: in  chair;with call bell/phone within reach;with family/visitor present Nurse Communication:  (BP) PT Visit Diagnosis: Unsteadiness on feet (R26.81);Muscle weakness (generalized) (M62.81)    Time: 1610-9604 PT Time Calculation (min) (ACUTE ONLY): 18 min   Charges:   PT Evaluation $PT Eval Low Complexity: 1 Low   PT General Charges $$ ACUTE PT VISIT: 1 Visit         Aleda Grana, PT, DPT 06/13/23, 3:30 PM   Sandi Mariscal 06/13/2023, 3:28 PM

## 2023-06-13 NOTE — Plan of Care (Signed)
  Problem: Education: Goal: Individualized Educational Video(s) 06/13/2023 1515 by Elnita Maxwell, RN Outcome: Progressing 06/13/2023 1515 by Elnita Maxwell, RN Outcome: Progressing   Problem: Coping: Goal: Ability to adjust to condition or change in health will improve 06/13/2023 1515 by Elnita Maxwell, RN Outcome: Progressing 06/13/2023 1515 by Elnita Maxwell, RN Outcome: Progressing   Problem: Fluid Volume: Goal: Ability to maintain a balanced intake and output will improve 06/13/2023 1515 by Elnita Maxwell, RN Outcome: Progressing 06/13/2023 1515 by Elnita Maxwell, RN Outcome: Progressing   Problem: Health Behavior/Discharge Planning: Goal: Ability to manage health-related needs will improve 06/13/2023 1515 by Elnita Maxwell, RN Outcome: Progressing 06/13/2023 1515 by Elnita Maxwell, RN Outcome: Progressing   Problem: Clinical Measurements: Goal: Cardiovascular complication will be avoided 06/13/2023 1515 by Elnita Maxwell, RN Outcome: Progressing 06/13/2023 1515 by Elnita Maxwell, RN Outcome: Progressing   Problem: Activity: Goal: Risk for activity intolerance will decrease 06/13/2023 1515 by Elnita Maxwell, RN Outcome: Progressing 06/13/2023 1515 by Elnita Maxwell, RN Outcome: Progressing   Problem: Safety: Goal: Ability to remain free from injury will improve 06/13/2023 1515 by Elnita Maxwell, RN Outcome: Progressing 06/13/2023 1515 by Elnita Maxwell, RN Outcome: Progressing

## 2023-06-13 NOTE — Progress Notes (Signed)
Rounding Note    Patient Name: Travis Harmon Date of Encounter: 06/13/2023  [facility] Cardiologist: None new   Subjective   MSK neck pain. Continued dizziness. Currently in SR since 8 pm.  Inpatient Medications    Scheduled Meds:  insulin aspart  0-15 Units Subcutaneous TID WC   rosuvastatin  20 mg Oral Daily   Continuous Infusions:  diltiazem (CARDIZEM) infusion 5 mg/hr (06/13/23 0348)   heparin 1,400 Units/hr (06/13/23 0053)   PRN Meds: acetaminophen, labetalol, ondansetron (ZOFRAN) IV, oxyCODONE   Vital Signs    Vitals:   06/13/23 0715 06/13/23 0750 06/13/23 0800 06/13/23 0815  BP: (!) 177/94 (!) 152/76 (!) 142/99 (!) 166/97  Pulse: 75 70 87 79  Resp: 12 10 15 14   Temp:      TempSrc:      SpO2: 100% 100% 100% 99%  Weight:      Height:       No intake or output data in the 24 hours ending 06/13/23 0836    06/12/2023    2:53 PM 02/09/2012    8:34 PM  Last 3 Weights  Weight (lbs) 270 lb 290 lb  Weight (kg) 122.471 kg 131.543 kg      Telemetry    SR, T wave abnormality - Personally Reviewed  ECG    Afib 06/12/23 - Personally Reviewed  Physical Exam   GEN: No acute distress.   Neck: No JVD Cardiac: RRR, no murmurs, rubs, or gallops.  Respiratory: Clear to auscultation bilaterally. GI: Soft, nontender, non-distended  MS: No edema; No deformity. Neuro:  Nonfocal  Psych: Normal affect   Labs    High Sensitivity Troponin:   Recent Labs  Lab 06/12/23 1526 06/12/23 1715  TROPONINIHS 9 17     Chemistry Recent Labs  Lab 06/12/23 1526 06/13/23 0624  NA 136 137  K 4.0 3.8  CL 103 105  CO2 23 23  GLUCOSE 142* 142*  BUN 10 9  CREATININE 1.18 1.02  CALCIUM 9.3 8.4*  MG 2.0 1.9  GFRNONAA >60 >60  ANIONGAP 10 9    Lipids  Recent Labs  Lab 06/13/23 0500  CHOL 207*  TRIG 201*  HDL 37*  LDLCALC 130*  CHOLHDL 5.6    Hematology Recent Labs  Lab 06/12/23 1526 06/13/23 0500  WBC 3.5* 4.7  RBC 5.19 4.95  HGB 14.2 14.0   HCT 44.0 42.4  MCV 84.8 85.7  MCH 27.4 28.3  MCHC 32.3 33.0  RDW 14.1 14.5  PLT 221 243   Thyroid  Recent Labs  Lab 06/12/23 1526  TSH 0.587    BNP Recent Labs  Lab 06/12/23 1526  BNP 43.2    DDimer No results for input(s): "DDIMER" in the last 168 hours.   Radiology    CT Angio Chest Pulmonary Embolism (PE) W or WO Contrast  Result Date: 06/12/2023 CLINICAL DATA:  Syncope EXAM: CT ANGIOGRAPHY CHEST WITH CONTRAST TECHNIQUE: Multidetector CT imaging of the chest was performed using the standard protocol during bolus administration of intravenous contrast. Multiplanar CT image reconstructions and MIPs were obtained to evaluate the vascular anatomy. RADIATION DOSE REDUCTION: This exam was performed according to the departmental dose-optimization program which includes automated exposure control, adjustment of the mA and/or kV according to patient size and/or use of iterative reconstruction technique. CONTRAST:  75mL OMNIPAQUE IOHEXOL 350 MG/ML SOLN COMPARISON:  None Available. FINDINGS: Cardiovascular: No evidence of pulmonary embolus. Evaluation the subsegmental pulmonary arteries is somewhat limited due to motion  artifact. Normal heart size. Trace pericardial effusion. Normal caliber thoracic aorta with mild atherosclerotic disease. Mild coronary artery calcifications. Mediastinum/Nodes: Esophagus and thyroid are unremarkable. No enlarged lymph nodes seen in the chest. Lungs/Pleura: Central airways are patent. Mild bilateral ground-glass opacities which are likely due to atelectasis given expiratory phase of imaging. No consolidation, pleural effusion or pneumothorax. Upper Abdomen: No acute abnormality. Musculoskeletal: No chest wall abnormality. No acute or significant osseous findings. Review of the MIP images confirms the above findings. IMPRESSION: 1. No evidence of pulmonary embolus. Evaluation of the subsegmental pulmonary arteries is somewhat limited due to motion artifact. 2. Mild  bilateral ground-glass opacities which are likely due to atelectasis. 3. Aortic Atherosclerosis (ICD10-I70.0). Electronically Signed   By: Allegra Lai M.D.   On: 06/12/2023 20:01   CT Head Wo Contrast  Result Date: 06/12/2023 CLINICAL DATA:  Head and neck trauma EXAM: CT HEAD WITHOUT CONTRAST CT CERVICAL SPINE WITHOUT CONTRAST TECHNIQUE: Multidetector CT imaging of the head and cervical spine was performed following the standard protocol without intravenous contrast. Multiplanar CT image reconstructions of the cervical spine were also generated. RADIATION DOSE REDUCTION: This exam was performed according to the departmental dose-optimization program which includes automated exposure control, adjustment of the mA and/or kV according to patient size and/or use of iterative reconstruction technique. COMPARISON:  None Available. FINDINGS: CT HEAD FINDINGS Brain: No evidence of acute infarction, hemorrhage, hydrocephalus, extra-axial collection or mass lesion/mass effect. Vascular: No hyperdense vessel or unexpected calcification. Skull: Normal. Negative for fracture or focal lesion. Sinuses/Orbits: No acute finding. Other: None. CT CERVICAL SPINE FINDINGS Alignment: Normal. Skull base and vertebrae: No acute fracture. No primary bone lesion or focal pathologic process. Soft tissues and spinal canal: No prevertebral fluid or swelling. No visible canal hematoma. Disc levels: Anterior cervical discectomy and fusion of C5-C6. Generally mild disc space height loss and osteophytosis otherwise. Upper chest: Negative. Other: None. IMPRESSION: 1. No acute intracranial pathology. 2. No fracture or static subluxation of the cervical spine. 3. Anterior cervical discectomy and fusion of C5-C6. Electronically Signed   By: Jearld Lesch M.D.   On: 06/12/2023 16:28   CT Cervical Spine Wo Contrast  Result Date: 06/12/2023 CLINICAL DATA:  Head and neck trauma EXAM: CT HEAD WITHOUT CONTRAST CT CERVICAL SPINE WITHOUT CONTRAST  TECHNIQUE: Multidetector CT imaging of the head and cervical spine was performed following the standard protocol without intravenous contrast. Multiplanar CT image reconstructions of the cervical spine were also generated. RADIATION DOSE REDUCTION: This exam was performed according to the departmental dose-optimization program which includes automated exposure control, adjustment of the mA and/or kV according to patient size and/or use of iterative reconstruction technique. COMPARISON:  None Available. FINDINGS: CT HEAD FINDINGS Brain: No evidence of acute infarction, hemorrhage, hydrocephalus, extra-axial collection or mass lesion/mass effect. Vascular: No hyperdense vessel or unexpected calcification. Skull: Normal. Negative for fracture or focal lesion. Sinuses/Orbits: No acute finding. Other: None. CT CERVICAL SPINE FINDINGS Alignment: Normal. Skull base and vertebrae: No acute fracture. No primary bone lesion or focal pathologic process. Soft tissues and spinal canal: No prevertebral fluid or swelling. No visible canal hematoma. Disc levels: Anterior cervical discectomy and fusion of C5-C6. Generally mild disc space height loss and osteophytosis otherwise. Upper chest: Negative. Other: None. IMPRESSION: 1. No acute intracranial pathology. 2. No fracture or static subluxation of the cervical spine. 3. Anterior cervical discectomy and fusion of C5-C6. Electronically Signed   By: Jearld Lesch M.D.   On: 06/12/2023 16:28   DG Chest  Port 1 View  Result Date: 06/12/2023 CLINICAL DATA:  fall EXAM: PORTABLE CHEST 1 VIEW COMPARISON:  Chest, 09/13/2016. FINDINGS: Cardiac silhouette is within normal limits. Aortic arch calcifications. Lungs are hypoinflated. No focal consolidation or mass. No pleural effusion or pneumothorax. Cervical fusion hardware, imaged. No acute displaced fracture. IMPRESSION: 1. Hypoinflation without acute superimposed cardiopulmonary process. 2.  Aortic Atherosclerosis (ICD10-I70.0).  Electronically Signed   By: Roanna Banning M.D.   On: 06/12/2023 15:20    Cardiac Studies   Echo pending.  Patient Profile     Travis Harmon is a 62 y.o. male with a hx of HTN, HLD, T2DM who is being seen today for the evaluation of atrial fibrillation at the request of Dr. Durwin Nora.    Assessment & Plan    Principal Problem:   Atrial fibrillation with rapid ventricular response (HCC) Active Problems:   Syncope   Essential hypertension   DM (diabetes mellitus), type 2 (HCC)   Atrial fibrillation (HCC)   New onset atrial fibrillation Syncope - syncope and afib etiology unclear. - CHADSVASC is 2 (HTN, DM). Transition to eliquis 5 mg BID today.  - ECHO pending. - ok to stop diltiazem drip and resume home BP meds - maintain telemetry - CTA PE study independently reviewed - small anterior pericardial effusion, no PE, no obvious structural heart disease on nongated nondedicated images. Appendage not well deliniated, LA mildly dilated.   #. T2DM - f/u A1c - agree with insulin per primary team   #. Hypertension BP currently 150s/80s. As an outpatient patient would benefit from better BP control - ok to resume home Bp meds and stop dilt drip, can adjust meds after results of echo.    #. HLD Wife reports pt has HLD. Cholesterol labs not visible here - lipids elevated, resume home statin.       For questions or updates, please contact [facility] Please consult [email or web address] for contact info under        Signed, Parke Poisson, MD  06/13/2023, 8:36 AM

## 2023-06-13 NOTE — ED Notes (Signed)
ED TO INPATIENT HANDOFF REPORT  ED Nurse Name and Phone #: Percival Spanish [phone]  S Name/Age/Gender Travis Harmon 62 y.o. male Room/Bed: 037C/037C  Code Status   Code Status: Full Code  Home/SNF/Other Home Patient oriented to: self, place, time, and situation Is this baseline? Yes   Triage Complete: Triage complete  Chief Complaint Atrial fibrillation [facility]) [I48.91]  Triage Note Pt BIB EMS for afib with RVR. Pt was eating at restaurant got up to leave and had a syncopal episode, fell and hit head on curb. Small laceration on back of head. Denies CP, SHOB. Initial HR 140s-190s. EMS administered 20mg  IV cardizem with response, HR decreased to 80s-130s. CBG 232. 20G L AC.   Allergies No Known Allergies  Level of Care/Admitting Diagnosis ED Disposition     ED Disposition  Admit   Condition  --   Comment  Hospital Area: [facility] [[id number]]  Level of Care: Telemetry Cardiac [[id number]]  May place patient in observation at [facility] or [facility] if equivalent level of care is available:: No  Covid Evaluation: Asymptomatic - no recent exposure (last 10 days) testing not required  Diagnosis: Atrial fibrillation (HCC) [427.31.ICD-9-CM]  Admitting Physician: Osvaldo Shipper [[id number]]  Attending Physician: Osvaldo Shipper [[id number]]          B Medical/Surgery History Past Medical History:  Diagnosis Date   Diabetes mellitus without complication (HCC)    Hypercholesteremia    Hypertension    Past Surgical History:  Procedure Laterality Date   SPINAL FUSION       A IV Location/Drains/Wounds Patient Lines/Drains/Airways Status     Active Line/Drains/Airways     Name Placement date Placement time Site Days   Peripheral IV 06/12/23 20 G Left Antecubital 06/12/23  --  Antecubital  1   Peripheral IV 06/12/23 20 G Anterior;Proximal;Right Forearm 06/12/23  1914  Forearm  1            Intake/Output Last 24 hours  Intake/Output Summary (Last 24 hours) at  06/13/2023 1238 Last data filed at 06/13/2023 1135 Gross per 24 hour  Intake 462.03 ml  Output --  Net 462.03 ml    Labs/Imaging Results for orders placed or performed during the hospital encounter of 06/12/23 (from the past 48 hour(s))  Basic metabolic panel     Status: Abnormal   Collection Time: 06/12/23  3:26 PM  Result Value Ref Range   Sodium 136 135 - 145 mmol/L   Potassium 4.0 3.5 - 5.1 mmol/L   Chloride 103 98 - 111 mmol/L   CO2 23 22 - 32 mmol/L   Glucose, Bld 142 (H) 70 - 99 mg/dL    Comment: Glucose reference range applies only to samples taken after fasting for at least 8 hours.   BUN 10 6 - 20 mg/dL   Creatinine, Ser 9.14 0.61 - 1.24 mg/dL   Calcium 9.3 8.9 - 78.2 mg/dL   GFR, Estimated >95 >62 mL/min    Comment: (NOTE) Calculated using the CKD-EPI Creatinine Equation (2021)    Anion gap 10 5 - 15    Comment: Performed at [facility], [address], [place], [place] [address]  Magnesium     Status: None   Collection Time: 06/12/23  3:26 PM  Result Value Ref Range   Magnesium 2.0 1.7 - 2.4 mg/dL    Comment: Performed at [facility], [address] [place], [place] [address]  CBC     Status: Abnormal  Collection Time: 06/12/23  3:26 PM  Result Value Ref Range   WBC 3.5 (L) 4.0 - 10.5 K/uL   RBC 5.19 4.22 - 5.81 MIL/uL   Hemoglobin 14.2 13.0 - 17.0 g/dL   HCT 62.1 30.8 - 65.7 %   MCV 84.8 80.0 - 100.0 fL   MCH 27.4 26.0 - 34.0 pg   MCHC 32.3 30.0 - 36.0 g/dL   RDW 84.6 96.2 - 95.2 %   Platelets 221 150 - 400 K/uL   nRBC 0.0 0.0 - 0.2 %    Comment: Performed at [facility], [address], [place], [place] [address]  TSH     Status: None   Collection Time: 06/12/23  3:26 PM  Result Value Ref Range   TSH 0.587 0.350 - 4.500 uIU/mL    Comment: Performed by a 3rd Generation assay with a functional sensitivity of <=0.01 uIU/mL. Performed at [facility], [address] [place], [place] [address]   Brain natriuretic peptide (IF  shortness of breath has been documented this visit)     Status: None   Collection Time: 06/12/23  3:26 PM  Result Value Ref Range   B Natriuretic Peptide 43.2 0.0 - 100.0 pg/mL    Comment: Performed at [facility], [address], [place], [place] [address]  Troponin I (High Sensitivity)     Status: None   Collection Time: 06/12/23  3:26 PM  Result Value Ref Range   Troponin I (High Sensitivity) 9 <18 ng/L    Comment: (NOTE) Elevated high sensitivity troponin I (hsTnI) values and significant  changes across serial measurements may suggest ACS but many other  chronic and acute conditions are known to elevate hsTnI results.  Refer to the "Links" section for chest pain algorithms and additional  guidance. Performed at [facility], [address], [place], [place] [address]   Protime-INR (if pt is taking coumadin)     Status: None   Collection Time: 06/12/23  3:26 PM  Result Value Ref Range   Prothrombin Time 12.9 11.4 - 15.2 seconds   INR 1.0 0.8 - 1.2    Comment: (NOTE) INR goal varies based on device and disease states. Performed at [facility], [address], [place], [place] [address]   Troponin I (High Sensitivity)     Status: None   Collection Time: 06/12/23  5:15 PM  Result Value Ref Range   Troponin I (High Sensitivity) 17 <18 ng/L    Comment: (NOTE) Elevated high sensitivity troponin I (hsTnI) values and significant  changes across serial measurements may suggest ACS but many other  chronic and acute conditions are known to elevate hsTnI results.  Refer to the "Links" section for chest pain algorithms and additional  guidance. Performed at [facility], [address], [place], [place] [address]   CBG monitoring, ED     Status: Abnormal   Collection Time: 06/12/23  9:57 PM  Result Value Ref Range   Glucose-Capillary 154 (H) 70 - 99 mg/dL    Comment: Glucose reference range applies only to samples taken after fasting for at least 8 hours.   Heparin level (unfractionated)     Status: Abnormal   Collection Time: 06/13/23 12:03 AM  Result Value Ref Range   Heparin Unfractionated 0.88 (H) 0.30 - 0.70 IU/mL    Comment: (NOTE) The clinical reportable range upper limit is being lowered to >1.10 to align with the FDA approved guidance  for the current laboratory assay.  If heparin results are below expected values, and patient dosage has  been confirmed, suggest follow up testing of antithrombin III levels. Performed at [facility], [address] [place], [place] [address]   HIV Antibody (routine testing w rflx)     Status: None   Collection Time: 06/13/23  5:00 AM  Result Value Ref Range   HIV Screen 4th Generation wRfx Non Reactive Non Reactive    Comment: Performed at [facility], [address] [place], [place] [address]  Lipid panel     Status: Abnormal   Collection Time: 06/13/23  5:00 AM  Result Value Ref Range   Cholesterol 207 (H) 0 - 200 mg/dL   Triglycerides 098 (H) <150 mg/dL   HDL 37 (L) >11 mg/dL   Total CHOL/HDL Ratio 5.6 RATIO   VLDL 40 0 - 40 mg/dL   LDL Cholesterol 914 (H) 0 - 99 mg/dL    Comment:        Total Cholesterol/HDL:CHD Risk Coronary Heart Disease Risk Table                     Men   Women  1/2 Average Risk   3.4   3.3  Average Risk       5.0   4.4  2 X Average Risk   9.6   7.1  3 X Average Risk  23.4   11.0        Use the calculated Patient Ratio above and the CHD Risk Table to determine the patient's CHD Risk.        ATP III CLASSIFICATION (LDL):  <100     mg/dL   Optimal  782-956  mg/dL   Near or Above                    Optimal  130-159  mg/dL   Borderline  213-086  mg/dL   High  >578     mg/dL   Very High Performed at [facility], [address], [place], [place] [address]   CBC     Status: None   Collection Time: 06/13/23  5:00 AM  Result Value Ref Range   WBC 4.7 4.0 - 10.5 K/uL   RBC 4.95 4.22 - 5.81 MIL/uL   Hemoglobin 14.0 13.0 - 17.0 g/dL    HCT 95.2 84.1 - 32.4 %   MCV 85.7 80.0 - 100.0 fL   MCH 28.3 26.0 - 34.0 pg   MCHC 33.0 30.0 - 36.0 g/dL   RDW 40.1 02.7 - 25.3 %   Platelets 243 150 - 400 K/uL   nRBC 0.0 0.0 - 0.2 %    Comment: Performed at [facility], [address], [place], [place] [address]  Basic metabolic panel     Status: Abnormal   Collection Time: 06/13/23  6:24 AM  Result Value Ref Range   Sodium 137 135 - 145 mmol/L   Potassium 3.8 3.5 - 5.1 mmol/L    Comment: HEMOLYSIS AT THIS LEVEL MAY AFFECT RESULT   Chloride 105 98 - 111 mmol/L   CO2 23 22 - 32 mmol/L   Glucose, Bld 142 (H) 70 - 99 mg/dL    Comment: Glucose reference range applies only to samples taken after fasting for at least 8 hours.   BUN 9 6 - 20 mg/dL   Creatinine, Ser 3.47 0.61 - 1.24 mg/dL   Calcium  8.4 (L) 8.9 - 10.3 mg/dL   GFR, Estimated >16 >10 mL/min    Comment: (NOTE) Calculated using the CKD-EPI Creatinine Equation (2021)    Anion gap 9 5 - 15    Comment: Performed at [facility], [address] [place], [place] [address]  Magnesium     Status: None   Collection Time: 06/13/23  6:24 AM  Result Value Ref Range   Magnesium 1.9 1.7 - 2.4 mg/dL    Comment: Performed at [facility], [address] [place], [place] [address]  Heparin level (unfractionated)     Status: Abnormal   Collection Time: 06/13/23  7:30 AM  Result Value Ref Range   Heparin Unfractionated 0.71 (H) 0.30 - 0.70 IU/mL    Comment: (NOTE) The clinical reportable range upper limit is being lowered to >1.10 to align with the FDA approved guidance for the current laboratory assay.  If heparin results are below expected values, and patient dosage has  been confirmed, suggest follow up testing of antithrombin III levels. Performed at [facility], [address] [place], [place] [address]   CBG monitoring, ED     Status: Abnormal   Collection Time: 06/13/23  8:25 AM  Result Value Ref Range   Glucose-Capillary 155 (H) 70 - 99 mg/dL     Comment: Glucose reference range applies only to samples taken after fasting for at least 8 hours.   ECHOCARDIOGRAM COMPLETE  Result Date: 06/13/2023    ECHOCARDIOGRAM REPORT   Patient Name:   Travis Harmon Date of Exam: 06/13/2023 Medical Rec #:  [id number]      Height:       72.0 in Accession #:    [id number]     Weight:       270.0 lb Date of Birth:  [birth year]     BSA:          2.420 m Patient Age:    61 years       BP:           160/87 mmHg Patient Gender: M              HR:           73 bpm. Exam Location:  Inpatient Procedure: 2D Echo, Cardiac Doppler and Color Doppler Indications:    A fib  History:        Patient has no prior history of Echocardiogram examinations.                 Arrythmias:Atrial Fibrillation, Signs/Symptoms:Syncope; Risk                 Factors:Hypertension and Diabetes.  Sonographer:    Melissa Morford RDCS (AE, PE) Referring Phys: [id number] Osvaldo Shipper IMPRESSIONS  1. Left ventricular ejection fraction, by estimation, is 60 to 65%. The left ventricle has normal function. The left ventricle has no regional wall motion abnormalities. There is mild concentric left ventricular hypertrophy. Left ventricular diastolic parameters are consistent with Grade I diastolic dysfunction (impaired relaxation).  2. Right ventricular systolic function is normal. The right ventricular size is normal. Tricuspid regurgitation signal is inadequate for assessing PA pressure.  3. The mitral valve is normal in structure. Trivial mitral valve regurgitation. No evidence of mitral stenosis.  4. The aortic valve is tricuspid. Aortic valve regurgitation is not visualized. No aortic stenosis is present.  5. A small pericardial effusion is present. The pericardial effusion is localized near the right ventricle.  6. The IVC was not visualized. FINDINGS  Left Ventricle: Left ventricular ejection fraction, by estimation, is 60 to 65%. The left ventricle has normal function. The left ventricle has no regional wall  motion abnormalities. The left ventricular internal cavity size was normal in size. There is  mild concentric left ventricular hypertrophy. Left ventricular diastolic parameters are consistent with Grade I diastolic dysfunction (impaired relaxation). Right Ventricle: The right ventricular size is normal. No increase in right ventricular wall thickness. Right ventricular systolic function is normal. Tricuspid regurgitation signal is inadequate for assessing PA pressure. Left Atrium: Left atrial size was normal in size. Right Atrium: Right atrial size was normal in size. Pericardium: A small pericardial effusion is present. The pericardial effusion is localized near the right ventricle. Mitral Valve: The mitral valve is normal in structure. Trivial mitral valve regurgitation. No evidence of mitral valve stenosis. Tricuspid Valve: The tricuspid valve is normal in structure. Tricuspid valve regurgitation is not demonstrated. Aortic Valve: The aortic valve is tricuspid. Aortic valve regurgitation is not visualized. No aortic stenosis is present. Pulmonic Valve: The pulmonic valve was normal in structure. Pulmonic valve regurgitation is not visualized. Aorta: The aortic root is normal in size and structure. Venous: The inferior vena cava was not well visualized. IAS/Shunts: No atrial level shunt detected by color flow Doppler.  LEFT VENTRICLE PLAX 2D LVIDd:         4.30 cm   Diastology LVIDs:         2.70 cm   LV e' medial:    4.46 cm/s LV PW:         1.50 cm   LV E/e' medial:  15.3 LV IVS:        1.40 cm   LV e' lateral:   6.09 cm/s LVOT diam:     2.10 cm   LV E/e' lateral: 11.2 LV SV:         62 LV SV Index:   26 LVOT Area:     3.46 cm  RIGHT VENTRICLE RV S prime:     14.50 cm/s TAPSE (M-mode): 2.1 cm LEFT ATRIUM             Index        RIGHT ATRIUM           Index LA diam:        4.30 cm 1.78 cm/m   RA Area:     20.60 cm LA Vol (A2C):   45.1 ml 18.63 ml/m  RA Volume:   59.50 ml  24.58 ml/m LA Vol (A4C):   48.9 ml  20.20 ml/m LA Biplane Vol: 49.3 ml 20.37 ml/m  AORTIC VALVE LVOT Vmax:   88.10 cm/s LVOT Vmean:  63.100 cm/s LVOT VTI:    0.180 m  AORTA Ao Root diam: 3.00 cm MITRAL VALVE MV Area (PHT): 3.61 cm    SHUNTS MV Decel Time: 210 msec    Systemic VTI:  0.18 m MV E velocity: 68.10 cm/s  Systemic Diam: 2.10 cm MV A velocity: 65.60 cm/s MV E/A ratio:  1.04 Dalton McleanMD Electronically signed by Wilfred Lacy Signature Date/Time: 06/13/2023/11:49:53 AM    Final    CT Angio Chest Pulmonary Embolism (PE) W or WO Contrast  Result Date: 06/12/2023 CLINICAL DATA:  Syncope EXAM: CT ANGIOGRAPHY CHEST WITH CONTRAST TECHNIQUE: Multidetector CT imaging of the chest was performed using the standard protocol during bolus administration of intravenous contrast. Multiplanar CT image reconstructions and MIPs were obtained to evaluate the vascular  anatomy. RADIATION DOSE REDUCTION: This exam was performed according to the departmental dose-optimization program which includes automated exposure control, adjustment of the mA and/or kV according to patient size and/or use of iterative reconstruction technique. CONTRAST:  75mL OMNIPAQUE IOHEXOL 350 MG/ML SOLN COMPARISON:  None Available. FINDINGS: Cardiovascular: No evidence of pulmonary embolus. Evaluation the subsegmental pulmonary arteries is somewhat limited due to motion artifact. Normal heart size. Trace pericardial effusion. Normal caliber thoracic aorta with mild atherosclerotic disease. Mild coronary artery calcifications. Mediastinum/Nodes: Esophagus and thyroid are unremarkable. No enlarged lymph nodes seen in the chest. Lungs/Pleura: Central airways are patent. Mild bilateral ground-glass opacities which are likely due to atelectasis given expiratory phase of imaging. No consolidation, pleural effusion or pneumothorax. Upper Abdomen: No acute abnormality. Musculoskeletal: No chest wall abnormality. No acute or significant osseous findings. Review of the MIP images confirms  the above findings. IMPRESSION: 1. No evidence of pulmonary embolus. Evaluation of the subsegmental pulmonary arteries is somewhat limited due to motion artifact. 2. Mild bilateral ground-glass opacities which are likely due to atelectasis. 3. Aortic Atherosclerosis (ICD10-I70.0). Electronically Signed   By: Allegra Lai M.D.   On: 06/12/2023 20:01   CT Head Wo Contrast  Result Date: 06/12/2023 CLINICAL DATA:  Head and neck trauma EXAM: CT HEAD WITHOUT CONTRAST CT CERVICAL SPINE WITHOUT CONTRAST TECHNIQUE: Multidetector CT imaging of the head and cervical spine was performed following the standard protocol without intravenous contrast. Multiplanar CT image reconstructions of the cervical spine were also generated. RADIATION DOSE REDUCTION: This exam was performed according to the departmental dose-optimization program which includes automated exposure control, adjustment of the mA and/or kV according to patient size and/or use of iterative reconstruction technique. COMPARISON:  None Available. FINDINGS: CT HEAD FINDINGS Brain: No evidence of acute infarction, hemorrhage, hydrocephalus, extra-axial collection or mass lesion/mass effect. Vascular: No hyperdense vessel or unexpected calcification. Skull: Normal. Negative for fracture or focal lesion. Sinuses/Orbits: No acute finding. Other: None. CT CERVICAL SPINE FINDINGS Alignment: Normal. Skull base and vertebrae: No acute fracture. No primary bone lesion or focal pathologic process. Soft tissues and spinal canal: No prevertebral fluid or swelling. No visible canal hematoma. Disc levels: Anterior cervical discectomy and fusion of C5-C6. Generally mild disc space height loss and osteophytosis otherwise. Upper chest: Negative. Other: None. IMPRESSION: 1. No acute intracranial pathology. 2. No fracture or static subluxation of the cervical spine. 3. Anterior cervical discectomy and fusion of C5-C6. Electronically Signed   By: Jearld Lesch M.D.   On: 06/12/2023  16:28   CT Cervical Spine Wo Contrast  Result Date: 06/12/2023 CLINICAL DATA:  Head and neck trauma EXAM: CT HEAD WITHOUT CONTRAST CT CERVICAL SPINE WITHOUT CONTRAST TECHNIQUE: Multidetector CT imaging of the head and cervical spine was performed following the standard protocol without intravenous contrast. Multiplanar CT image reconstructions of the cervical spine were also generated. RADIATION DOSE REDUCTION: This exam was performed according to the departmental dose-optimization program which includes automated exposure control, adjustment of the mA and/or kV according to patient size and/or use of iterative reconstruction technique. COMPARISON:  None Available. FINDINGS: CT HEAD FINDINGS Brain: No evidence of acute infarction, hemorrhage, hydrocephalus, extra-axial collection or mass lesion/mass effect. Vascular: No hyperdense vessel or unexpected calcification. Skull: Normal. Negative for fracture or focal lesion. Sinuses/Orbits: No acute finding. Other: None. CT CERVICAL SPINE FINDINGS Alignment: Normal. Skull base and vertebrae: No acute fracture. No primary bone lesion or focal pathologic process. Soft tissues and spinal canal: No prevertebral fluid or swelling. No visible canal hematoma.  Disc levels: Anterior cervical discectomy and fusion of C5-C6. Generally mild disc space height loss and osteophytosis otherwise. Upper chest: Negative. Other: None. IMPRESSION: 1. No acute intracranial pathology. 2. No fracture or static subluxation of the cervical spine. 3. Anterior cervical discectomy and fusion of C5-C6. Electronically Signed   By: Jearld Lesch M.D.   On: 06/12/2023 16:28   DG Chest Port 1 View  Result Date: 06/12/2023 CLINICAL DATA:  fall EXAM: PORTABLE CHEST 1 VIEW COMPARISON:  Chest, 09/13/2016. FINDINGS: Cardiac silhouette is within normal limits. Aortic arch calcifications. Lungs are hypoinflated. No focal consolidation or mass. No pleural effusion or pneumothorax. Cervical fusion hardware,  imaged. No acute displaced fracture. IMPRESSION: 1. Hypoinflation without acute superimposed cardiopulmonary process. 2.  Aortic Atherosclerosis (ICD10-I70.0). Electronically Signed   By: Roanna Banning M.D.   On: 06/12/2023 15:20    Pending Labs Unresulted Labs (From admission, onward)     Start     Ordered   06/14/23 0500  Heparin level (unfractionated)  Daily,   R      06/12/23 1728   06/14/23 0500  CBC  Tomorrow morning,   R        06/13/23 0907   06/14/23 0500  Comprehensive metabolic panel  Tomorrow morning,   R        06/13/23 0907   06/13/23 0500  Hemoglobin A1c  Tomorrow morning,   R       Comments: To assess prior glycemic control    06/12/23 1806            Vitals/Pain Today's Vitals   06/13/23 1110 06/13/23 1115 06/13/23 1130 06/13/23 1145  BP: (!) 159/86 (!) 150/84 (!) 149/92 (!) 162/89  Pulse: 71 72 74 72  Resp: (!) 9 11 12 13   Temp:      TempSrc:      SpO2: 99% 98% 100% 99%  Weight:      Height:      PainSc:        Isolation Precautions No active isolations  Medications Medications  oxyCODONE (Oxy IR/ROXICODONE) immediate release tablet 5 mg (has no administration in time range)  rosuvastatin (CRESTOR) tablet 20 mg (20 mg Oral Given 06/13/23 1000)  acetaminophen (TYLENOL) tablet 650 mg (650 mg Oral Given 06/13/23 1001)  ondansetron (ZOFRAN) injection 4 mg (has no administration in time range)  insulin aspart (novoLOG) injection 0-15 Units (3 Units Subcutaneous Given 06/13/23 0833)  labetalol (NORMODYNE) injection 10 mg (has no administration in time range)  metoprolol tartrate (LOPRESSOR) tablet 25 mg (25 mg Oral Given 06/13/23 1000)  apixaban (ELIQUIS) tablet 5 mg (5 mg Oral Given 06/13/23 1001)  magnesium sulfate 2 GM/50ML IVPB (has no administration in time range)  perflutren lipid microspheres (DEFINITY) IV suspension (2 mLs Intravenous Given 06/13/23 1042)  diltiazem (CARDIZEM) 1 mg/mL load via infusion 10 mg (10 mg Intravenous Bolus from Bag 06/12/23 1538)   acetaminophen (TYLENOL) tablet 650 mg (650 mg Oral Given 06/12/23 1543)  Tdap (BOOSTRIX) injection 0.5 mL (0.5 mLs Intramuscular Given 06/12/23 1543)  heparin bolus via infusion 5,500 Units (5,500 Units Intravenous Bolus from Bag 06/12/23 1903)  oxyCODONE-acetaminophen (PERCOCET/ROXICET) 5-325 MG per tablet 1 tablet (1 tablet Oral Given 06/12/23 1808)  iohexol (OMNIPAQUE) 350 MG/ML injection 75 mL (75 mLs Intravenous Contrast Given 06/12/23 1955)  potassium chloride SA (KLOR-CON M) CR tablet 40 mEq (40 mEq Oral Given 06/13/23 1000)  magnesium sulfate IVPB 2 g 50 mL (0 g Intravenous Stopped 06/13/23 1135)    Mobility  walks     Focused Assessments Cardiac Assessment Handoff:  Cardiac Rhythm: Normal sinus rhythm No results found for: "CKTOTAL", "CKMB", "CKMBINDEX", "TROPONINI" Lab Results  Component Value Date   DDIMER <0.27 09/13/2016   Does the Patient currently have chest pain? No    R Recommendations: See Admitting Provider Note  Report given to:   Additional Notes:

## 2023-06-14 ENCOUNTER — Other Ambulatory Visit (HOSPITAL_COMMUNITY): Payer: Self-pay

## 2023-06-14 ENCOUNTER — Inpatient Hospital Stay (HOSPITAL_BASED_OUTPATIENT_CLINIC_OR_DEPARTMENT_OTHER)
Admit: 2023-06-14 | Discharge: 2023-06-14 | Disposition: A | Discharge status: Home / Self Care | Payer: BLUE CROSS/BLUE SHIELD | Attending: Internal Medicine | Admitting: Internal Medicine

## 2023-06-14 DIAGNOSIS — R55 Syncope and collapse: Secondary | ICD-10-CM | POA: Diagnosis not present

## 2023-06-14 DIAGNOSIS — I10 Essential (primary) hypertension: Secondary | ICD-10-CM | POA: Diagnosis not present

## 2023-06-14 DIAGNOSIS — E669 Obesity, unspecified: Secondary | ICD-10-CM | POA: Diagnosis not present

## 2023-06-14 DIAGNOSIS — Z79899 Other long term (current) drug therapy: Secondary | ICD-10-CM | POA: Diagnosis not present

## 2023-06-14 DIAGNOSIS — I4891 Unspecified atrial fibrillation: Secondary | ICD-10-CM | POA: Diagnosis not present

## 2023-06-14 LAB — COMPREHENSIVE METABOLIC PANEL
ALT: 24 U/L (ref 0–44)
AST: 18 U/L (ref 15–41)
Albumin: 3.5 g/dL (ref 3.5–5.0)
Alkaline Phosphatase: 57 U/L (ref 38–126)
Anion gap: 7 (ref 5–15)
BUN: 10 mg/dL (ref 6–20)
CO2: 25 mmol/L (ref 22–32)
Calcium: 9 mg/dL (ref 8.9–10.3)
Chloride: 104 mmol/L (ref 98–111)
Creatinine, Ser: 1.18 mg/dL (ref 0.61–1.24)
GFR, Estimated: >60 mL/min (ref >60)
Glucose, Bld: 134 mg/dL — ABNORMAL HIGH (ref 70–99)
Potassium: 3.6 mmol/L (ref 3.5–5.1)
Sodium: 136 mmol/L (ref 135–145)
Total Bilirubin: 0.5 mg/dL (ref 0.3–1.2)
Total Protein: 6.8 g/dL (ref 6.5–8.1)

## 2023-06-14 LAB — CBC
HCT: 41.5 % (ref 39.0–52.0)
Hemoglobin: 13.6 g/dL (ref 13.0–17.0)
MCH: 27.9 pg (ref 26.0–34.0)
MCHC: 32.8 g/dL (ref 30.0–36.0)
MCV: 85.2 fL (ref 80.0–100.0)
Platelets: 222 10*3/uL (ref 150–400)
RBC: 4.87 MIL/uL (ref 4.22–5.81)
RDW: 14.1 % (ref 11.5–15.5)
WBC: 4.4 10*3/uL (ref 4.0–10.5)
nRBC: 0 % (ref 0.0–0.2)

## 2023-06-14 LAB — GLUCOSE, CAPILLARY
Glucose-Capillary: 132 mg/dL — ABNORMAL HIGH (ref 70–99)
Glucose-Capillary: 121 mg/dL — ABNORMAL HIGH (ref 70–99)

## 2023-06-14 LAB — HEPARIN LEVEL (UNFRACTIONATED): Heparin Unfractionated: >1.1 IU/mL — ABNORMAL HIGH (ref 0.30–0.70)

## 2023-06-14 LAB — HEMOGLOBIN A1C
Hgb A1c MFr Bld: 6.9 % — ABNORMAL HIGH (ref 4.8–5.6)
Mean Plasma Glucose: 151 mg/dL

## 2023-06-14 MED ORDER — APIXABAN 5 MG PO TABS
5.0000 mg | ORAL_TABLET | Freq: Two times a day (BID) | ORAL | 2 refills | Status: AC
  Filled 2023-06-14 – 2023-07-18 (×4): qty 60, 30d supply, fill #0

## 2023-06-14 MED ORDER — AMLODIPINE BESYLATE 10 MG PO TABS
10.0000 mg | ORAL_TABLET | Freq: Every day | ORAL | Status: DC
  Administered 2023-06-14: 10 mg via ORAL
  Filled 2023-06-14: qty 1

## 2023-06-14 MED ORDER — ROSUVASTATIN CALCIUM 20 MG PO TABS
20.0000 mg | ORAL_TABLET | Freq: Every day | ORAL | 2 refills | Status: AC
  Filled 2023-06-14 – 2023-07-18 (×4): qty 30, 30d supply, fill #0

## 2023-06-14 MED ORDER — AMLODIPINE BESYLATE-VALSARTAN 10-160 MG PO TABS: 1.0000 | ORAL_TABLET | Freq: Every day | ORAL | Status: DC

## 2023-06-14 MED ORDER — METOPROLOL TARTRATE 25 MG PO TABS
25.0000 mg | ORAL_TABLET | Freq: Two times a day (BID) | ORAL | 2 refills | Status: AC
  Filled 2023-06-14 – 2023-07-18 (×4): qty 60, 30d supply, fill #0

## 2023-06-14 MED ORDER — MECLIZINE HCL 12.5 MG PO TABS
12.5000 mg | ORAL_TABLET | Freq: Three times a day (TID) | ORAL | 0 refills | Status: AC | PRN
  Filled 2023-06-14: qty 30, 10d supply, fill #0

## 2023-06-14 MED ORDER — AMLODIPINE BESYLATE-VALSARTAN 10-160 MG PO TABS
1.0000 | ORAL_TABLET | Freq: Every day | ORAL | 2 refills | Status: AC
  Filled 2023-06-14 – 2023-07-18 (×3): qty 30, 30d supply, fill #0

## 2023-06-14 MED ORDER — ACETAMINOPHEN 325 MG PO TABS: 650.0000 mg | ORAL_TABLET | ORAL | Status: AC | PRN

## 2023-06-14 MED ORDER — LABETALOL HCL 5 MG/ML IV SOLN
10.0000 mg | INTRAVENOUS | Status: DC | PRN
  Administered 2023-06-14: 20 mg via INTRAVENOUS
  Filled 2023-06-14: qty 4

## 2023-06-14 MED ORDER — MECLIZINE HCL 25 MG PO TABS: 12.5000 mg | ORAL_TABLET | Freq: Three times a day (TID) | ORAL | Status: DC | PRN

## 2023-06-14 MED ORDER — IRBESARTAN 300 MG PO TABS
150.0000 mg | ORAL_TABLET | Freq: Every day | ORAL | Status: DC
  Administered 2023-06-14: 150 mg via ORAL
  Filled 2023-06-14: qty 1

## 2023-06-14 NOTE — TOC Initial Note (Signed)
Transition of Care [facility]) - Initial/Assessment Note    Patient Details  Name: DESHUN SEDIVY MRN: [id number] Date of Birth: 08-18-1961  Transition of Care [facility]) CM/SW Contact:    Leone Haven, RN Phone Number: 06/14/2023, 8:43 AM  Clinical Narrative:                 From home with wife, indep ptad, self ambulatory.  He has PCP Cheral Almas at [facility], [phone].  Follow up on AVS.  He has insurance on file.  He currently has no HH services in place at this time or DME at home. His wife is his support system. She will be transporting him home at discharge. He gets his medications from [facility] in [place] [address].  He will be on Eliquis, NCM gave him the 10.00 co pay coupon for refillls, went over with him about his copay, amount of 291.52 due to unmet deductible of 325.00.    Expected Discharge Plan: Home/Self Care Barriers to Discharge: No Barriers Identified   Patient Goals and CMS Choice Patient states their goals for this hospitalization and ongoing recovery are:: return home with wife   Choice offered to / list presented to : NA      Expected Discharge Plan and Services In-house Referral: NA Discharge Planning Services: CM Consult Post Acute Care Choice: NA Living arrangements for the past 2 months: Single Family Home                 DME Arranged: N/A DME Agency: NA       HH Arranged: NA          Prior Living Arrangements/Services Living arrangements for the past 2 months: Single Family Home Lives with:: Spouse Patient language and need for interpreter reviewed:: Yes Do you feel safe going back to the place where you live?: Yes      Need for Family Participation in Patient Care: Yes (Comment) Care giver support system in place?: Yes (comment)   Criminal Activity/Legal Involvement Pertinent to Current Situation/Hospitalization: No - Comment as needed  Activities of Daily Living Home Assistive Devices/Equipment: None ADL Screening  (condition at time of admission) Patient's cognitive ability adequate to safely complete daily activities?: Yes Is the patient deaf or have difficulty hearing?: No Does the patient have difficulty seeing, even when wearing glasses/contacts?: No Does the patient have difficulty concentrating, remembering, or making decisions?: No Patient able to express need for assistance with ADLs?: Yes Does the patient have difficulty dressing or bathing?: No Independently performs ADLs?: Yes (appropriate for developmental age) Does the patient have difficulty walking or climbing stairs?: No Weakness of Legs: Both Weakness of Arms/Hands: Both  Permission Sought/Granted Permission sought to share information with : Case Manager Permission granted to share information with : Yes, Verbal Permission Granted              Emotional Assessment Appearance:: Appears stated age Attitude/Demeanor/Rapport: Engaged Affect (typically observed): Appropriate Orientation: : Oriented to Self, Oriented to Place, Oriented to  Time, Oriented to Situation Alcohol / Substance Use: Not Applicable Psych Involvement: No (comment)  Admission diagnosis:  Atrial fibrillation (HCC) [I48.91] Syncope and collapse [R55] Atrial fibrillation with RVR (HCC) [I48.91] Patient Active Problem List   Diagnosis Date Noted   Atrial fibrillation with rapid ventricular response (HCC) 06/12/2023   Syncope 06/12/2023   Essential hypertension 06/12/2023   DM (diabetes mellitus), type 2 (HCC) 06/12/2023   Atrial fibrillation (HCC) 06/12/2023   PCP:  Patient, No  Pcp Per Pharmacy:   [id number] - [place], [place] - [address] [place] [address] Phone: [phone] Fax: [phone]     Social Determinants of Health (SDOH) Social History: SDOH Screenings   Food Insecurity: No Food Insecurity (06/13/2023)  Housing: Low Risk  (06/13/2023)  Transportation Needs: No  Transportation Needs (06/13/2023)  Utilities: Not At Risk (06/13/2023)  Tobacco Use: Unknown (06/12/2023)   SDOH Interventions:     Readmission Risk Interventions     No data to display

## 2023-06-14 NOTE — Discharge Instructions (Signed)

## 2023-06-14 NOTE — Progress Notes (Signed)
Pt blood pressure elevated gave bl/p meds assigned nurse will reassess

## 2023-06-14 NOTE — TOC Transition Note (Addendum)
Transition of Care [facility]) - CM/SW Discharge Note   Patient Details  Name: Travis Harmon MRN: [id number] Date of Birth: 02-05-1961  Transition of Care [facility]) CM/SW Contact:  Leone Haven, RN Phone Number: 06/14/2023, 8:47 AM   Clinical Narrative:    For dc today, wife at bedside and will transport him home today. Cards to place zio patch meds sent to [facility] .   Final next level of care: Home/Self Care Barriers to Discharge: No Barriers Identified   Patient Goals and CMS Choice   Choice offered to / list presented to : NA  Discharge Placement                         Discharge Plan and Services Additional resources added to the After Visit Summary for   In-house Referral: NA Discharge Planning Services: CM Consult Post Acute Care Choice: NA          DME Arranged: N/A DME Agency: NA       HH Arranged: NA          Social Determinants of Health (SDOH) Interventions SDOH Screenings   Food Insecurity: No Food Insecurity (06/13/2023)  Housing: Low Risk  (06/13/2023)  Transportation Needs: No Transportation Needs (06/13/2023)  Utilities: Not At Risk (06/13/2023)  Tobacco Use: Unknown (06/12/2023)     Readmission Risk Interventions     No data to display

## 2023-06-14 NOTE — Discharge Summary (Signed)
[facility]  Physician Discharge Summary   Patient ID: ABDULSALAM BERDINE MRN: [id number] DOB/AGE: March 16, 1961 62 y.o.  Admit date: 06/12/2023 Discharge date:   06/14/2023   PCP: Patient, No Pcp Per  DISCHARGE DIAGNOSES:    Atrial fibrillation with rapid ventricular response (HCC)   Syncope   Essential hypertension   DM (diabetes mellitus), type 2 (HCC)   Atrial fibrillation (HCC)   RECOMMENDATIONS FOR OUTPATIENT FOLLOW UP: Cardiology to arrange outpatient follow-up   Home Health: None Equipment/Devices: None  CODE STATUS: Full code  DISCHARGE CONDITION: fair  Diet recommendation:  heart healthy  INITIAL HISTORY:  63 y.o. male with a past medical history of essential hypertension and diabetes mellitus type 2 and hyperlipidemia who presented after he sustained a syncopal episode.  He was noted to be in atrial fibrillation with RVR.  He was hospitalized for further management.     Consultants: Cardiology   Procedures: Echocardiogram    HOSPITAL COURSE:   Atrial fibrillation with RVR TSH was normal.  Echocardiogram shows normal systolic function.  CT angiogram chest was negative for PE. Patient was placed on diltiazem infusion.  He has converted to sinus rhythm. Seen by cardiology.  Placed on beta-blocker.  He was also placed on IV heparin and then transitioned to apixaban.  Mild concussion Patient had headache dizziness lightheadedness after sustaining a fall when he had a syncopal episode.  He has his head on the floor.  CT head was unremarkable as well as CT cervical spine.  Likely had mild concussion.  Seems to be doing better this morning.  Has ambulated to the bathroom and back without difficulty.  He was reassured.  Meclizine can be taken as needed at home.  Tylenol as needed for pain.   Syncope Most likely secondary to atrial fibrillation.  No PE on CT angiogram.  Echocardiogram shows normal systolic function without any significant valvular abnormalities.   Zio patch to be placed prior to discharge per cardiology.   Essential hypertension Blood pressure remains poorly for.  He has not been compliant with his antihypertensives.  Has not taken any antihypertensives since October 2023.  Started back on amlodipine and ARB combination.   Diabetes mellitus type 2 HbA1c is 6.9.  Continue home medications.   Hyperlipidemia LDL is 130.  Patient was not compliant with his rosuvastatin.  Has not taken it since October of last year.  This is likely reason for his elevated LDL.  He was asked to be compliant with his medications going forward.  Prescription has been sent for same.    Obesity Estimated body mass index is 36.62 kg/m as calculated from the following:   Height as of this encounter: 6' (1.829 m).   Weight as of this encounter: 122.5 kg.   Patient is feeling better.  Blood pressure is noted to be elevated but should get under control now that he will be placed back on his antihypertensive.  Otherwise he is stable for discharge.  Zio patch to be placed prior to discharge.  PERTINENT LABS:  The results of significant diagnostics from this hospitalization (including imaging, microbiology, ancillary and laboratory) are listed below for reference.      Labs:   Basic Metabolic Panel: Recent Labs  Lab 06/12/23 1526 06/13/23 0624 06/14/23 0044  NA 136 137 136  K 4.0 3.8 3.6  CL 103 105 104  CO2 23 23 25   GLUCOSE 142* 142* 134*  BUN 10 9 10   CREATININE 1.18 1.02 1.18  CALCIUM 9.3  8.4* 9.0  MG 2.0 1.9  --    Liver Function Tests: Recent Labs  Lab 06/14/23 0044  AST 18  ALT 24  ALKPHOS 57  BILITOT 0.5  PROT 6.8  ALBUMIN 3.5    CBC: Recent Labs  Lab 06/12/23 1526 06/13/23 0500 06/14/23 0044  WBC 3.5* 4.7 4.4  HGB 14.2 14.0 13.6  HCT 44.0 42.4 41.5  MCV 84.8 85.7 85.2  PLT 221 243 222    BNP: BNP (last 3 results) Recent Labs    06/12/23 1526  BNP 43.2     CBG: Recent Labs  Lab 06/13/23 1256  06/13/23 1718 06/13/23 2101 06/14/23 0556 06/14/23 1130  GLUCAP 128* 105* 131* 132* 121*     IMAGING STUDIES ECHOCARDIOGRAM COMPLETE  Result Date: 06/13/2023    ECHOCARDIOGRAM REPORT   Patient Name:   JAVONE PISTONE Date of Exam: 06/13/2023 Medical Rec #:  [id number]      Height:       72.0 in Accession #:    [id number]     Weight:       270.0 lb Date of Birth:  [birth year]     BSA:          2.420 m Patient Age:    61 years       BP:           160/87 mmHg Patient Gender: M              HR:           73 bpm. Exam Location:  Inpatient Procedure: 2D Echo, Cardiac Doppler and Color Doppler Indications:    A fib  History:        Patient has no prior history of Echocardiogram examinations.                 Arrythmias:Atrial Fibrillation, Signs/Symptoms:Syncope; Risk                 Factors:Hypertension and Diabetes.  Sonographer:    Melissa Morford RDCS (AE, PE) Referring Phys: [id number] Osvaldo Shipper IMPRESSIONS  1. Left ventricular ejection fraction, by estimation, is 60 to 65%. The left ventricle has normal function. The left ventricle has no regional wall motion abnormalities. There is mild concentric left ventricular hypertrophy. Left ventricular diastolic parameters are consistent with Grade I diastolic dysfunction (impaired relaxation).  2. Right ventricular systolic function is normal. The right ventricular size is normal. Tricuspid regurgitation signal is inadequate for assessing PA pressure.  3. The mitral valve is normal in structure. Trivial mitral valve regurgitation. No evidence of mitral stenosis.  4. The aortic valve is tricuspid. Aortic valve regurgitation is not visualized. No aortic stenosis is present.  5. A small pericardial effusion is present. The pericardial effusion is localized near the right ventricle.  6. The IVC was not visualized. FINDINGS  Left Ventricle: Left ventricular ejection fraction, by estimation, is 60 to 65%. The left ventricle has normal function. The left ventricle has no  regional wall motion abnormalities. The left ventricular internal cavity size was normal in size. There is  mild concentric left ventricular hypertrophy. Left ventricular diastolic parameters are consistent with Grade I diastolic dysfunction (impaired relaxation). Right Ventricle: The right ventricular size is normal. No increase in right ventricular wall thickness. Right ventricular systolic function is normal. Tricuspid regurgitation signal is inadequate for assessing PA pressure. Left Atrium: Left atrial size was normal in size. Right Atrium: Right atrial size was normal in size. Pericardium: A small pericardial effusion  is present. The pericardial effusion is localized near the right ventricle. Mitral Valve: The mitral valve is normal in structure. Trivial mitral valve regurgitation. No evidence of mitral valve stenosis. Tricuspid Valve: The tricuspid valve is normal in structure. Tricuspid valve regurgitation is not demonstrated. Aortic Valve: The aortic valve is tricuspid. Aortic valve regurgitation is not visualized. No aortic stenosis is present. Pulmonic Valve: The pulmonic valve was normal in structure. Pulmonic valve regurgitation is not visualized. Aorta: The aortic root is normal in size and structure. Venous: The inferior vena cava was not well visualized. IAS/Shunts: No atrial level shunt detected by color flow Doppler.  LEFT VENTRICLE PLAX 2D LVIDd:         4.30 cm   Diastology LVIDs:         2.70 cm   LV e' medial:    4.46 cm/s LV PW:         1.50 cm   LV E/e' medial:  15.3 LV IVS:        1.40 cm   LV e' lateral:   6.09 cm/s LVOT diam:     2.10 cm   LV E/e' lateral: 11.2 LV SV:         62 LV SV Index:   26 LVOT Area:     3.46 cm  RIGHT VENTRICLE RV S prime:     14.50 cm/s TAPSE (M-mode): 2.1 cm LEFT ATRIUM             Index        RIGHT ATRIUM           Index LA diam:        4.30 cm 1.78 cm/m   RA Area:     20.60 cm LA Vol (A2C):   45.1 ml 18.63 ml/m  RA Volume:   59.50 ml  24.58 ml/m LA Vol  (A4C):   48.9 ml 20.20 ml/m LA Biplane Vol: 49.3 ml 20.37 ml/m  AORTIC VALVE LVOT Vmax:   88.10 cm/s LVOT Vmean:  63.100 cm/s LVOT VTI:    0.180 m  AORTA Ao Root diam: 3.00 cm MITRAL VALVE MV Area (PHT): 3.61 cm    SHUNTS MV Decel Time: 210 msec    Systemic VTI:  0.18 m MV E velocity: 68.10 cm/s  Systemic Diam: 2.10 cm MV A velocity: 65.60 cm/s MV E/A ratio:  1.04 Dalton McleanMD Electronically signed by Wilfred Lacy Signature Date/Time: 06/13/2023/11:49:53 AM    Final    CT Angio Chest Pulmonary Embolism (PE) W or WO Contrast  Result Date: 06/12/2023 CLINICAL DATA:  Syncope EXAM: CT ANGIOGRAPHY CHEST WITH CONTRAST TECHNIQUE: Multidetector CT imaging of the chest was performed using the standard protocol during bolus administration of intravenous contrast. Multiplanar CT image reconstructions and MIPs were obtained to evaluate the vascular anatomy. RADIATION DOSE REDUCTION: This exam was performed according to the departmental dose-optimization program which includes automated exposure control, adjustment of the mA and/or kV according to patient size and/or use of iterative reconstruction technique. CONTRAST:  75mL OMNIPAQUE IOHEXOL 350 MG/ML SOLN COMPARISON:  None Available. FINDINGS: Cardiovascular: No evidence of pulmonary embolus. Evaluation the subsegmental pulmonary arteries is somewhat limited due to motion artifact. Normal heart size. Trace pericardial effusion. Normal caliber thoracic aorta with mild atherosclerotic disease. Mild coronary artery calcifications. Mediastinum/Nodes: Esophagus and thyroid are unremarkable. No enlarged lymph nodes seen in the chest. Lungs/Pleura: Central airways are patent. Mild bilateral ground-glass opacities which are likely due to atelectasis given expiratory phase of imaging. No consolidation,  pleural effusion or pneumothorax. Upper Abdomen: No acute abnormality. Musculoskeletal: No chest wall abnormality. No acute or significant osseous findings. Review of the MIP  images confirms the above findings. IMPRESSION: 1. No evidence of pulmonary embolus. Evaluation of the subsegmental pulmonary arteries is somewhat limited due to motion artifact. 2. Mild bilateral ground-glass opacities which are likely due to atelectasis. 3. Aortic Atherosclerosis (ICD10-I70.0). Electronically Signed   By: Allegra Lai M.D.   On: 06/12/2023 20:01   CT Head Wo Contrast  Result Date: 06/12/2023 CLINICAL DATA:  Head and neck trauma EXAM: CT HEAD WITHOUT CONTRAST CT CERVICAL SPINE WITHOUT CONTRAST TECHNIQUE: Multidetector CT imaging of the head and cervical spine was performed following the standard protocol without intravenous contrast. Multiplanar CT image reconstructions of the cervical spine were also generated. RADIATION DOSE REDUCTION: This exam was performed according to the departmental dose-optimization program which includes automated exposure control, adjustment of the mA and/or kV according to patient size and/or use of iterative reconstruction technique. COMPARISON:  None Available. FINDINGS: CT HEAD FINDINGS Brain: No evidence of acute infarction, hemorrhage, hydrocephalus, extra-axial collection or mass lesion/mass effect. Vascular: No hyperdense vessel or unexpected calcification. Skull: Normal. Negative for fracture or focal lesion. Sinuses/Orbits: No acute finding. Other: None. CT CERVICAL SPINE FINDINGS Alignment: Normal. Skull base and vertebrae: No acute fracture. No primary bone lesion or focal pathologic process. Soft tissues and spinal canal: No prevertebral fluid or swelling. No visible canal hematoma. Disc levels: Anterior cervical discectomy and fusion of C5-C6. Generally mild disc space height loss and osteophytosis otherwise. Upper chest: Negative. Other: None. IMPRESSION: 1. No acute intracranial pathology. 2. No fracture or static subluxation of the cervical spine. 3. Anterior cervical discectomy and fusion of C5-C6. Electronically Signed   By: Jearld Lesch M.D.    On: 06/12/2023 16:28   CT Cervical Spine Wo Contrast  Result Date: 06/12/2023 CLINICAL DATA:  Head and neck trauma EXAM: CT HEAD WITHOUT CONTRAST CT CERVICAL SPINE WITHOUT CONTRAST TECHNIQUE: Multidetector CT imaging of the head and cervical spine was performed following the standard protocol without intravenous contrast. Multiplanar CT image reconstructions of the cervical spine were also generated. RADIATION DOSE REDUCTION: This exam was performed according to the departmental dose-optimization program which includes automated exposure control, adjustment of the mA and/or kV according to patient size and/or use of iterative reconstruction technique. COMPARISON:  None Available. FINDINGS: CT HEAD FINDINGS Brain: No evidence of acute infarction, hemorrhage, hydrocephalus, extra-axial collection or mass lesion/mass effect. Vascular: No hyperdense vessel or unexpected calcification. Skull: Normal. Negative for fracture or focal lesion. Sinuses/Orbits: No acute finding. Other: None. CT CERVICAL SPINE FINDINGS Alignment: Normal. Skull base and vertebrae: No acute fracture. No primary bone lesion or focal pathologic process. Soft tissues and spinal canal: No prevertebral fluid or swelling. No visible canal hematoma. Disc levels: Anterior cervical discectomy and fusion of C5-C6. Generally mild disc space height loss and osteophytosis otherwise. Upper chest: Negative. Other: None. IMPRESSION: 1. No acute intracranial pathology. 2. No fracture or static subluxation of the cervical spine. 3. Anterior cervical discectomy and fusion of C5-C6. Electronically Signed   By: Jearld Lesch M.D.   On: 06/12/2023 16:28   DG Chest Port 1 View  Result Date: 06/12/2023 CLINICAL DATA:  fall EXAM: PORTABLE CHEST 1 VIEW COMPARISON:  Chest, 09/13/2016. FINDINGS: Cardiac silhouette is within normal limits. Aortic arch calcifications. Lungs are hypoinflated. No focal consolidation or mass. No pleural effusion or pneumothorax. Cervical  fusion hardware, imaged. No acute displaced fracture. IMPRESSION: 1. Hypoinflation  without acute superimposed cardiopulmonary process. 2.  Aortic Atherosclerosis (ICD10-I70.0). Electronically Signed   By: Roanna Banning M.D.   On: 06/12/2023 15:20    DISCHARGE EXAMINATION: Vitals:   06/14/23 0552 06/14/23 0623 06/14/23 0740 06/14/23 1134  BP:  (!) 165/111 (!) 148/94 (!) 188/97  Pulse:   93 84  Resp:   18   Temp:      TempSrc:   Oral   SpO2:   98% 100%  Weight: 114.8 kg     Height:       General appearance: Awake alert.  In no distress Resp: Clear to auscultation bilaterally.  Normal effort Cardio: S1-S2 is normal regular.  No S3-S4.  No rubs murmurs or bruit GI: Abdomen is soft.  Nontender nondistended.  Bowel sounds are present normal.  No masses organomegaly Extremities: No edema.  Full range of motion of lower extremities. Neurologic: Alert and oriented x3.  No focal neurological deficits.    DISPOSITION: Home  Discharge Instructions     Amb referral to [facility]IB Clinic   Complete by: As directed    Call MD for:  difficulty breathing, headache or visual disturbances   Complete by: As directed    Call MD for:  extreme fatigue   Complete by: As directed    Call MD for:  persistant dizziness or light-headedness   Complete by: As directed    Call MD for:  persistant nausea and vomiting   Complete by: As directed    Call MD for:  severe uncontrolled pain   Complete by: As directed    Call MD for:  temperature >100.4   Complete by: As directed    Diet - low sodium heart healthy   Complete by: As directed    Diet Carb Modified   Complete by: As directed    Discharge instructions   Complete by: As directed    Please take your medications as prescribed.  Follow instructions given by the cardiologist.  You were cared for by a hospitalist during your hospital stay. If you have any questions about your discharge medications or the care you received while you were in the hospital after  you are discharged, you can call the unit and asked to speak with the hospitalist on call if the hospitalist that took care of you is not available. Once you are discharged, your primary care physician will handle any further medical issues. Please note that NO REFILLS for any discharge medications will be authorized once you are discharged, as it is imperative that you return to your primary care physician (or establish a relationship with a primary care physician if you do not have one) for your aftercare needs so that they can reassess your need for medications and monitor your lab values. If you do not have a primary care physician, you can call [phone] for a physician referral.   Increase activity slowly   Complete by: As directed          Allergies as of 06/14/2023   No Known Allergies      Medication List     STOP taking these medications    chlorthalidone 25 MG tablet Commonly known as: HYGROTON   Jardiance 10 MG Tabs tablet Generic drug: empagliflozin   metFORMIN 750 MG 24 hr tablet Commonly known as: GLUCOPHAGE-XR       TAKE these medications    acetaminophen 325 MG tablet Commonly known as: TYLENOL Take 2 tablets (650 mg total) by mouth every 4 (four)  hours as needed for headache or mild pain.   amLODipine-valsartan 10-160 MG tablet Commonly known as: EXFORGE Take 1 tablet by mouth daily.   apixaban 5 MG Tabs tablet Commonly known as: ELIQUIS Take 1 tablet (5 mg total) by mouth 2 (two) times daily.   meclizine 12.5 MG tablet Commonly known as: ANTIVERT Take 1 tablet (12.5 mg total) by mouth 3 (three) times daily as needed for dizziness or nausea.   metoprolol tartrate 25 MG tablet Commonly known as: LOPRESSOR Take 1 tablet (25 mg total) by mouth 2 (two) times daily.   rosuvastatin 20 MG tablet Commonly known as: CRESTOR Take 1 tablet (20 mg total) by mouth daily.   Trulicity 3 MG/0.5ML Sopn Generic drug: Dulaglutide Inject 3 mg into the skin every  7 (seven) days. On Sunday of each week          Follow-up Information     Michelle Nasuti Follow up.   Why: The office will call patient. Contact information: [phone]        Parke Poisson, MD Follow up.   Specialties: Cardiology, Radiology Why: [facility] location - cardiology follow-up arranged 06/30/23 at 10:40am (arrive at 10:25am to check in) Contact information: [address] [place] [address] [phone]                 TOTAL DISCHARGE TIME: 35 minutes  Mylissa Lambe Rito Ehrlich  [facility] Pager on [email or web address]  06/14/2023, 11:35 AM

## 2023-06-14 NOTE — Progress Notes (Signed)
14 day live Zio ordered to be placed by hospital EKG team prior to DC. F/u has been scheduled. Notified IM and nurse of need to wait for Zio to be placed before letting him officially go home.

## 2023-06-14 NOTE — Plan of Care (Signed)

## 2023-06-14 NOTE — TOC Benefit Eligibility Note (Addendum)
Pharmacy Patient Advocate Encounter  Insurance verification completed.    The patient is insured through [facility]   Ran test claim for Eliquis 5 mg and the current 30 day co-pay is $291.52 due to a $325.00 deductible.   Ran test claim for amlodipine-valsartan (Exforge) 10-160 mg and the current 30 day co-pay is $10.41.    This test claim was processed through [facility]- copay amounts may vary at other pharmacies due to pharmacy/plan contracts, or as the patient moves through the different stages of their insurance plan.    Roland Earl, CPHT Pharmacy Patient Advocate Specialist [facility] Patient Advocate Team Direct Number: [phone]  Fax: [phone]

## 2023-06-14 NOTE — Progress Notes (Signed)
Pt Discharge instructions given, IV removed from Right FA, Left AC,  Transportation home with Wife.

## 2023-06-14 NOTE — Progress Notes (Signed)
Rounding Note    Patient Name: Travis Harmon Date of Encounter: 06/14/2023  [facility] Cardiologist: None new   Subjective   MSK neck pain. Continued dizziness. Currently in SR since 8 pm.  Inpatient Medications    Scheduled Meds:  amLODipine  10 mg Oral Daily   And   irbesartan  150 mg Oral Daily   apixaban  5 mg Oral BID   insulin aspart  0-15 Units Subcutaneous TID WC   metoprolol tartrate  25 mg Oral BID   rosuvastatin  20 mg Oral Daily   Continuous Infusions:   PRN Meds: acetaminophen, labetalol, meclizine, ondansetron (ZOFRAN) IV, oxyCODONE   Vital Signs    Vitals:   06/14/23 0503 06/14/23 0552 06/14/23 0623 06/14/23 0740  BP: (!) 170/95  (!) 165/111 (!) 148/94  Pulse: 85   93  Resp: 18   18  Temp: 98 F (36.7 C)     TempSrc: Oral   Oral  SpO2: 97%   98%  Weight:  114.8 kg    Height:        Intake/Output Summary (Last 24 hours) at 06/14/2023 1036 Last data filed at 06/14/2023 0936 Gross per 24 hour  Intake 290 ml  Output --  Net 290 ml      06/14/2023    5:52 AM 06/12/2023    2:53 PM 02/09/2012    8:34 PM  Last 3 Weights  Weight (lbs) 253 lb 1.4 oz 270 lb 290 lb  Weight (kg) 114.8 kg 122.471 kg 131.543 kg      Telemetry    SR, T wave abnormality - Personally Reviewed  ECG    Afib 06/12/23 - Personally Reviewed  Physical Exam   GEN: No acute distress.   Neck: No JVD Cardiac: RRR, no murmurs, rubs, or gallops.  Respiratory: Clear to auscultation bilaterally. GI: Soft, nontender, non-distended  MS: No edema; No deformity. Neuro:  Nonfocal  Psych: Normal affect   Labs    High Sensitivity Troponin:   Recent Labs  Lab 06/12/23 1526 06/12/23 1715  TROPONINIHS 9 17     Chemistry Recent Labs  Lab 06/12/23 1526 06/13/23 0624 06/14/23 0044  NA 136 137 136  K 4.0 3.8 3.6  CL 103 105 104  CO2 23 23 25   GLUCOSE 142* 142* 134*  BUN 10 9 10   CREATININE 1.18 1.02 1.18  CALCIUM 9.3 8.4* 9.0  MG 2.0 1.9  --   PROT  --   --   6.8  ALBUMIN  --   --  3.5  AST  --   --  18  ALT  --   --  24  ALKPHOS  --   --  57  BILITOT  --   --  0.5  GFRNONAA >60 >60 >60  ANIONGAP 10 9 7     Lipids  Recent Labs  Lab 06/13/23 0500  CHOL 207*  TRIG 201*  HDL 37*  LDLCALC 130*  CHOLHDL 5.6    Hematology Recent Labs  Lab 06/12/23 1526 06/13/23 0500 06/14/23 0044  WBC 3.5* 4.7 4.4  RBC 5.19 4.95 4.87  HGB 14.2 14.0 13.6  HCT 44.0 42.4 41.5  MCV 84.8 85.7 85.2  MCH 27.4 28.3 27.9  MCHC 32.3 33.0 32.8  RDW 14.1 14.5 14.1  PLT 221 243 222   Thyroid  Recent Labs  Lab 06/12/23 1526  TSH 0.587    BNP Recent Labs  Lab 06/12/23 1526  BNP 43.2  DDimer No results for input(s): "DDIMER" in the last 168 hours.   Radiology    ECHOCARDIOGRAM COMPLETE  Result Date: 06/13/2023    ECHOCARDIOGRAM REPORT   Patient Name:   Travis Harmon Date of Exam: 06/13/2023 Medical Rec #:  [id number]      Height:       72.0 in Accession #:    [id number]     Weight:       270.0 lb Date of Birth:  1961/08/04     BSA:          2.420 m Patient Age:    61 years       BP:           160/87 mmHg Patient Gender: M              HR:           73 bpm. Exam Location:  Inpatient Procedure: 2D Echo, Cardiac Doppler and Color Doppler Indications:    A fib  History:        Patient has no prior history of Echocardiogram examinations.                 Arrythmias:Atrial Fibrillation, Signs/Symptoms:Syncope; Risk                 Factors:Hypertension and Diabetes.  Sonographer:    Melissa Morford RDCS (AE, PE) Referring Phys: [id number] Osvaldo Shipper IMPRESSIONS  1. Left ventricular ejection fraction, by estimation, is 60 to 65%. The left ventricle has normal function. The left ventricle has no regional wall motion abnormalities. There is mild concentric left ventricular hypertrophy. Left ventricular diastolic parameters are consistent with Grade I diastolic dysfunction (impaired relaxation).  2. Right ventricular systolic function is normal. The right ventricular size  is normal. Tricuspid regurgitation signal is inadequate for assessing PA pressure.  3. The mitral valve is normal in structure. Trivial mitral valve regurgitation. No evidence of mitral stenosis.  4. The aortic valve is tricuspid. Aortic valve regurgitation is not visualized. No aortic stenosis is present.  5. A small pericardial effusion is present. The pericardial effusion is localized near the right ventricle.  6. The IVC was not visualized. FINDINGS  Left Ventricle: Left ventricular ejection fraction, by estimation, is 60 to 65%. The left ventricle has normal function. The left ventricle has no regional wall motion abnormalities. The left ventricular internal cavity size was normal in size. There is  mild concentric left ventricular hypertrophy. Left ventricular diastolic parameters are consistent with Grade I diastolic dysfunction (impaired relaxation). Right Ventricle: The right ventricular size is normal. No increase in right ventricular wall thickness. Right ventricular systolic function is normal. Tricuspid regurgitation signal is inadequate for assessing PA pressure. Left Atrium: Left atrial size was normal in size. Right Atrium: Right atrial size was normal in size. Pericardium: A small pericardial effusion is present. The pericardial effusion is localized near the right ventricle. Mitral Valve: The mitral valve is normal in structure. Trivial mitral valve regurgitation. No evidence of mitral valve stenosis. Tricuspid Valve: The tricuspid valve is normal in structure. Tricuspid valve regurgitation is not demonstrated. Aortic Valve: The aortic valve is tricuspid. Aortic valve regurgitation is not visualized. No aortic stenosis is present. Pulmonic Valve: The pulmonic valve was normal in structure. Pulmonic valve regurgitation is not visualized. Aorta: The aortic root is normal in size and structure. Venous: The inferior vena cava was not well visualized. IAS/Shunts: No atrial level shunt detected by color  flow Doppler.  LEFT VENTRICLE  PLAX 2D LVIDd:         4.30 cm   Diastology LVIDs:         2.70 cm   LV e' medial:    4.46 cm/s LV PW:         1.50 cm   LV E/e' medial:  15.3 LV IVS:        1.40 cm   LV e' lateral:   6.09 cm/s LVOT diam:     2.10 cm   LV E/e' lateral: 11.2 LV SV:         62 LV SV Index:   26 LVOT Area:     3.46 cm  RIGHT VENTRICLE RV S prime:     14.50 cm/s TAPSE (M-mode): 2.1 cm LEFT ATRIUM             Index        RIGHT ATRIUM           Index LA diam:        4.30 cm 1.78 cm/m   RA Area:     20.60 cm LA Vol (A2C):   45.1 ml 18.63 ml/m  RA Volume:   59.50 ml  24.58 ml/m LA Vol (A4C):   48.9 ml 20.20 ml/m LA Biplane Vol: 49.3 ml 20.37 ml/m  AORTIC VALVE LVOT Vmax:   88.10 cm/s LVOT Vmean:  63.100 cm/s LVOT VTI:    0.180 m  AORTA Ao Root diam: 3.00 cm MITRAL VALVE MV Area (PHT): 3.61 cm    SHUNTS MV Decel Time: 210 msec    Systemic VTI:  0.18 m MV E velocity: 68.10 cm/s  Systemic Diam: 2.10 cm MV A velocity: 65.60 cm/s MV E/A ratio:  1.04 Dalton McleanMD Electronically signed by Wilfred Lacy Signature Date/Time: 06/13/2023/11:49:53 AM    Final    CT Angio Chest Pulmonary Embolism (PE) W or WO Contrast  Result Date: 06/12/2023 CLINICAL DATA:  Syncope EXAM: CT ANGIOGRAPHY CHEST WITH CONTRAST TECHNIQUE: Multidetector CT imaging of the chest was performed using the standard protocol during bolus administration of intravenous contrast. Multiplanar CT image reconstructions and MIPs were obtained to evaluate the vascular anatomy. RADIATION DOSE REDUCTION: This exam was performed according to the departmental dose-optimization program which includes automated exposure control, adjustment of the mA and/or kV according to patient size and/or use of iterative reconstruction technique. CONTRAST:  75mL OMNIPAQUE IOHEXOL 350 MG/ML SOLN COMPARISON:  None Available. FINDINGS: Cardiovascular: No evidence of pulmonary embolus. Evaluation the subsegmental pulmonary arteries is somewhat limited due to motion  artifact. Normal heart size. Trace pericardial effusion. Normal caliber thoracic aorta with mild atherosclerotic disease. Mild coronary artery calcifications. Mediastinum/Nodes: Esophagus and thyroid are unremarkable. No enlarged lymph nodes seen in the chest. Lungs/Pleura: Central airways are patent. Mild bilateral ground-glass opacities which are likely due to atelectasis given expiratory phase of imaging. No consolidation, pleural effusion or pneumothorax. Upper Abdomen: No acute abnormality. Musculoskeletal: No chest wall abnormality. No acute or significant osseous findings. Review of the MIP images confirms the above findings. IMPRESSION: 1. No evidence of pulmonary embolus. Evaluation of the subsegmental pulmonary arteries is somewhat limited due to motion artifact. 2. Mild bilateral ground-glass opacities which are likely due to atelectasis. 3. Aortic Atherosclerosis (ICD10-I70.0). Electronically Signed   By: Allegra Lai M.D.   On: 06/12/2023 20:01   CT Head Wo Contrast  Result Date: 06/12/2023 CLINICAL DATA:  Head and neck trauma EXAM: CT HEAD WITHOUT CONTRAST CT CERVICAL SPINE WITHOUT CONTRAST TECHNIQUE: Multidetector CT imaging of  the head and cervical spine was performed following the standard protocol without intravenous contrast. Multiplanar CT image reconstructions of the cervical spine were also generated. RADIATION DOSE REDUCTION: This exam was performed according to the departmental dose-optimization program which includes automated exposure control, adjustment of the mA and/or kV according to patient size and/or use of iterative reconstruction technique. COMPARISON:  None Available. FINDINGS: CT HEAD FINDINGS Brain: No evidence of acute infarction, hemorrhage, hydrocephalus, extra-axial collection or mass lesion/mass effect. Vascular: No hyperdense vessel or unexpected calcification. Skull: Normal. Negative for fracture or focal lesion. Sinuses/Orbits: No acute finding. Other: None. CT  CERVICAL SPINE FINDINGS Alignment: Normal. Skull base and vertebrae: No acute fracture. No primary bone lesion or focal pathologic process. Soft tissues and spinal canal: No prevertebral fluid or swelling. No visible canal hematoma. Disc levels: Anterior cervical discectomy and fusion of C5-C6. Generally mild disc space height loss and osteophytosis otherwise. Upper chest: Negative. Other: None. IMPRESSION: 1. No acute intracranial pathology. 2. No fracture or static subluxation of the cervical spine. 3. Anterior cervical discectomy and fusion of C5-C6. Electronically Signed   By: Jearld Lesch M.D.   On: 06/12/2023 16:28   CT Cervical Spine Wo Contrast  Result Date: 06/12/2023 CLINICAL DATA:  Head and neck trauma EXAM: CT HEAD WITHOUT CONTRAST CT CERVICAL SPINE WITHOUT CONTRAST TECHNIQUE: Multidetector CT imaging of the head and cervical spine was performed following the standard protocol without intravenous contrast. Multiplanar CT image reconstructions of the cervical spine were also generated. RADIATION DOSE REDUCTION: This exam was performed according to the departmental dose-optimization program which includes automated exposure control, adjustment of the mA and/or kV according to patient size and/or use of iterative reconstruction technique. COMPARISON:  None Available. FINDINGS: CT HEAD FINDINGS Brain: No evidence of acute infarction, hemorrhage, hydrocephalus, extra-axial collection or mass lesion/mass effect. Vascular: No hyperdense vessel or unexpected calcification. Skull: Normal. Negative for fracture or focal lesion. Sinuses/Orbits: No acute finding. Other: None. CT CERVICAL SPINE FINDINGS Alignment: Normal. Skull base and vertebrae: No acute fracture. No primary bone lesion or focal pathologic process. Soft tissues and spinal canal: No prevertebral fluid or swelling. No visible canal hematoma. Disc levels: Anterior cervical discectomy and fusion of C5-C6. Generally mild disc space height loss and  osteophytosis otherwise. Upper chest: Negative. Other: None. IMPRESSION: 1. No acute intracranial pathology. 2. No fracture or static subluxation of the cervical spine. 3. Anterior cervical discectomy and fusion of C5-C6. Electronically Signed   By: Jearld Lesch M.D.   On: 06/12/2023 16:28   DG Chest Port 1 View  Result Date: 06/12/2023 CLINICAL DATA:  fall EXAM: PORTABLE CHEST 1 VIEW COMPARISON:  Chest, 09/13/2016. FINDINGS: Cardiac silhouette is within normal limits. Aortic arch calcifications. Lungs are hypoinflated. No focal consolidation or mass. No pleural effusion or pneumothorax. Cervical fusion hardware, imaged. No acute displaced fracture. IMPRESSION: 1. Hypoinflation without acute superimposed cardiopulmonary process. 2.  Aortic Atherosclerosis (ICD10-I70.0). Electronically Signed   By: Roanna Banning M.D.   On: 06/12/2023 15:20    Cardiac Studies   Echo pending.  Patient Profile     Travis Harmon is a 62 y.o. male with a hx of HTN, HLD, T2DM who is being seen today for the evaluation of atrial fibrillation at the request of Dr. Durwin Nora.    Assessment & Plan    Principal Problem:   Atrial fibrillation with rapid ventricular response (HCC) Active Problems:   Syncope   Essential hypertension   DM (diabetes mellitus), type 2 (HCC)  Atrial fibrillation (HCC)   New onset atrial fibrillation Syncope - syncope and afib etiology unclear. - CHADSVASC is 2 (HTN, DM). Transition to eliquis 5 mg BID today.  - ECHO pending. - resume amlodipine valsartan, will resume chlorthalidone at follow up.  - CTA PE study independently reviewed - small anterior pericardial effusion, no PE, no obvious structural heart disease on nongated nondedicated images. Appendage not well deliniated, LA mildly dilated.   #. T2DM - f/u A1c - agree with insulin per primary team   #. Hypertension BP currently 170s. As an outpatient patient would benefit from better BP control Add meds back as above.    #.  HLD - lipids elevated, resume home statin.       For questions or updates, please contact  [facility] Please consult [email or web address] for contact info under        Signed, Parke Poisson, MD  06/14/2023, 10:36 AM

## 2023-06-30 ENCOUNTER — Ambulatory Visit: Payer: BLUE CROSS/BLUE SHIELD | Admitting: Internal Medicine

## 2023-06-30 ENCOUNTER — Telehealth: Payer: Self-pay

## 2023-06-30 NOTE — Telephone Encounter (Signed)
Spoke with Travis Harmon regarding follow up visit today. Monitor has not yet resulted so Dr. Rudene Anda would like for his appointment to be pushed so she will have these results to discuss with the Travis Harmon. Travis Harmon reschedule for 8/7 at 10:20am. Travis Harmon verbalizes understanding.

## 2023-07-06 NOTE — Addendum Note (Signed)
Encounter addended by: Flavia Shipper on: 07/06/2023 1:22 PM  Actions taken: Imaging Exam ended

## 2023-07-13 ENCOUNTER — Encounter: Payer: Self-pay | Admitting: Internal Medicine

## 2023-07-13 ENCOUNTER — Ambulatory Visit: Payer: BLUE CROSS/BLUE SHIELD | Attending: Internal Medicine | Admitting: Internal Medicine

## 2023-07-13 VITALS — BP 124/84 | HR 94 | Ht 72.0 in | Wt 261.2 lb

## 2023-07-13 DIAGNOSIS — I10 Essential (primary) hypertension: Secondary | ICD-10-CM

## 2023-07-13 DIAGNOSIS — I517 Cardiomegaly: Secondary | ICD-10-CM | POA: Diagnosis not present

## 2023-07-13 DIAGNOSIS — R55 Syncope and collapse: Secondary | ICD-10-CM | POA: Diagnosis not present

## 2023-07-13 NOTE — Progress Notes (Signed)
Cardiology Office Note:    Date:  07/13/2023   ID:  Travis Harmon, DOB 10-10-1961, MRN [id number]  PCP:  Patient, No Pcp Per  Cardiologist:  None  Electrophysiologist:  None   Referring MD: No ref. provider found   Chief Complaint/Reason for Referral: Syncope, atrial fibrillation  History of Present Illness:    Travis Harmon is a 62 y.o. male with a history of hypertension, hyperlipidemia, type 2 diabetes A1c 6.9%, who I met in the hospital with an episode of syncope and new onset atrial fibrillation with spontaneous conversion who presents for follow-up after wearing a cardiac monitor.  We reviewed his abnormal EKG again today with T wave inversions, which are most likely related to hypertension and changes of repolarization.  In the setting of syncope we performed a cardiac monitor which was overall unremarkable, no atrial fibrillation and no concerning findings to suggest malignant arrhythmia or pauses to contribute to syncope.  He does have positional dizziness and we discussed wearing compression socks and maintaining his hydration.  His syncopal episode that led to his hospitalization was prompted after a meal at a restaurant and subsequently he ran to the car while it was pouring rain to get his umbrella and when he turned around to run back to [employer] he fainted.  Possibly due to increased splanchnic blood flow and orthostasis suspect this episode may have been orthostatic in nature.  Echocardiogram is without acute findings but with mild LVH, and in the setting of an abnormal EKG cannot exclude abnormal myocardial concerns.  We reviewed this in detail with patient and his wife today, shared decision making determined we will do a cardiac MRI to further define LVH and abnormal EKG.  Orthostatic vital signs performed while in the office and are positive from lying to standing, symptomatic from lying to sitting and blood pressure orthostatics positive from lying to  standing.  Past Medical History:  Diagnosis Date   Diabetes mellitus without complication (HCC)    Hypercholesteremia    Hypertension     Past Surgical History:  Procedure Laterality Date   SPINAL FUSION      Current Medications: Current Meds  Medication Sig   acetaminophen (TYLENOL) 325 MG tablet Take 2 tablets (650 mg total) by mouth every 4 (four) hours as needed for headache or mild pain.   amLODipine-valsartan (EXFORGE) 10-160 MG tablet Take 1 tablet by mouth daily.   apixaban (ELIQUIS) 5 MG TABS tablet Take 1 tablet (5 mg total) by mouth 2 (two) times daily.   Dulaglutide (TRULICITY) 3 MG/0.5ML SOPN Inject 3 mg into the skin every 7 (seven) days. On Sunday of each week   metoprolol tartrate (LOPRESSOR) 25 MG tablet Take 1 tablet (25 mg total) by mouth 2 (two) times daily.   rosuvastatin (CRESTOR) 20 MG tablet Take 1 tablet (20 mg total) by mouth daily.     Allergies:   Patient has no known allergies.   Social History   Tobacco Use   Smoking status: Never  Substance Use Topics   Alcohol use: Yes   Drug use: No     Family History: The patient's family history includes Coronary artery disease in his father.  ROS:   Please see the history of present illness.    All other systems reviewed and are negative.  EKGs/Labs/Other Studies Reviewed:    The following studies were reviewed today:  EKG:   EKG Interpretation Date/Time:  Wednesday July 13 2023 10:16:46 EDT Ventricular Rate:  88  PR Interval:  134 QRS Duration:  86 QT Interval:  334 QTC Calculation: 404 R Axis:   18  Text Interpretation: Normal sinus rhythm Inferior infarct , age undetermined ST & T wave abnormality, consider lateral ischemia Confirmed by Weston Brass ([id number]) on 07/13/2023 10:34:49 AM   Imaging studies that I have independently reviewed today: Echocardiogram 06/13/2023  Recent Labs: 06/12/2023: B Natriuretic Peptide 43.2; TSH 0.587 06/13/2023: Magnesium 1.9 06/14/2023: ALT 24; BUN 10;  Creatinine, Ser 1.18; Hemoglobin 13.6; Platelets 222; Potassium 3.6; Sodium 136  Recent Lipid Panel    Component Value Date/Time   CHOL 207 (H) 06/13/2023 0500   TRIG 201 (H) 06/13/2023 0500   HDL 37 (L) 06/13/2023 0500   CHOLHDL 5.6 06/13/2023 0500   VLDL 40 06/13/2023 0500   LDLCALC 130 (H) 06/13/2023 0500    Physical Exam:    VS:  BP 124/84   Pulse 94   Ht 6' (1.829 m)   Wt 261 lb 3.2 oz (118.5 kg)   SpO2 98%   BMI 35.43 kg/m     Orthostatic VS for the past 72 hrs (Last 3 readings):  Orthostatic BP Patient Position Orthostatic Pulse  07/13/23 1108 110/75 Standing 97  07/13/23 1107 116/79 Sitting 87  07/13/23 1100 122/83 Supine 76     Wt Readings from Last 5 Encounters:  07/13/23 261 lb 3.2 oz (118.5 kg)  06/14/23 253 lb 1.4 oz (114.8 kg)  02/09/12 290 lb (131.5 kg)    Constitutional: No acute distress Eyes: sclera non-icteric, normal conjunctiva and lids ENMT: normal dentition, moist mucous membranes Cardiovascular: regular rhythm, normal rate, no murmur. S1 and S2 normal. No jugular venous distention.  Respiratory: clear to auscultation bilaterally GI : normal bowel sounds, soft and nontender. No distention.   MSK: extremities warm, well perfused. No edema.  NEURO: grossly nonfocal exam, moves all extremities. PSYCH: alert and oriented x 3, normal mood and affect.   ASSESSMENT:    1. Syncope and collapse   2. Essential hypertension   3. LVH (left ventricular hypertrophy)    PLAN:    Syncope and collapse - Plan: MR CARDIAC MORPHOLOGY W WO CONTRAST, CBC  Essential hypertension - Plan: EKG 12-Lead, MR CARDIAC MORPHOLOGY W WO CONTRAST, CBC  LVH (left ventricular hypertrophy) - Plan: MR CARDIAC MORPHOLOGY W WO CONTRAST, CBC  Patient syncopal episode seems that it may be orthostatic in nature as his symptoms were reproducible in the office with orthostatic vital signs.  I have encouraged him to maintain his hydration, avoid fast movements, and strongly  recommend compression socks which she should wear throughout the day particularly at times when he will have position changes.  He continues to have an abnormal EKG and despite a normal monitor remains concerned about etiology of dizziness and syncope.  Be reasonable to get a cardiac MRI at this point to study his left ventricular hypertrophy and ensure no delayed myocardial enhancement to suggest myocardial fibrosis as a source of arrhythmia.  Continue current therapy, tolerating Eliquis well with no bleeding concerns.    He had been out of his medications since October 2023 up until his hospitalization in July, his cholesterol labs likely reflect being off of rosuvastatin which he has resumed since, will recheck labs in 6 months or with primary to review if therapy is effective.    Total time of encounter: 25 minutes total time of encounter, including 15 minutes spent in face-to-face patient care on the date of this encounter. This time includes coordination of  care and counseling regarding above mentioned problem list. Remainder of non-face-to-face time involved reviewing chart documents/testing relevant to the patient encounter and documentation in the medical record. I have independently reviewed documentation from referring provider.   Weston Brass, MD, [facility] [place]  [facility]   Shared Decision Making/Informed Consent:       Medication Adjustments/Labs and Tests Ordered: Current medicines are reviewed at length with the patient today.  Concerns regarding medicines are outlined above.   Orders Placed This Encounter  Procedures   MR CARDIAC MORPHOLOGY W WO CONTRAST   CBC   EKG 12-Lead    No orders of the defined types were placed in this encounter.   Patient Instructions  Medication Instructions:  Your physician recommends that you continue on your current medications as directed. Please refer to the Current Medication list given to you today.  *If you need a refill  on your cardiac medications before your next appointment, please call your pharmacy*   Lab Work: Your physician recommends that you return for lab work in: CBC within 2 weeks of cardiac MRI  If you have labs (blood work) drawn today and your tests are completely normal, you will receive your results only by: MyChart Message (if you have MyChart) OR A paper copy in the mail If you have any lab test that is abnormal or we need to change your treatment, we will call you to review the results.   Testing/Procedures: See below   Follow-Up: At [facility], you and your health needs are our priority.  As part of our continuing mission to provide you with exceptional heart care, we have created designated Provider Care Teams.  These Care Teams include your primary Cardiologist (physician) and Advanced Practice Providers (APPs -  Physician Assistants and Nurse Practitioners) who all work together to provide you with the care you need, when you need it.  We recommend signing up for the patient portal called "MyChart".  Sign up information is provided on this After Visit Summary.  MyChart is used to connect with patients for Virtual Visits (Telemedicine).  Patients are able to view lab/test results, encounter notes, upcoming appointments, etc.  Non-urgent messages can be sent to your provider as well.   To learn more about what you can do with MyChart, go to [email or web address].    Your next appointment:   3 month(s)  Provider:   Dr. Jacques Navy  Other Instructions Dr. Jacques Navy recommends wearing compression stockings.      You are scheduled for Cardiac MRI on ______________. Please arrive for your appointment at ______________ ( arrive 30-45 minutes prior to test start time). ?  [facility] [address] [place], [place] [address] [phone] Please take advantage of the free valet parking available at the [facility] and [employer] (Entrance C).  Proceed to  the [facility] Radiology Department (First Floor) for check-in.   Magnetic resonance imaging (MRI) is a painless test that produces images of the inside of the body without using Xrays.  During an MRI, strong magnets and radio waves work together in a Data processing manager to form detailed images.   MRI images may provide more details about a medical condition than X-rays, CT scans, and ultrasounds can provide.  You may be given earphones to listen for instructions.  You may eat a light breakfast and take medications as ordered with the exception of furosemide, hydrochlorothiazide, or spironolactone(fluid pill, other). Please avoid stimulants for 12 hr prior to test. (Ie. Caffeine,  nicotine, chocolate, or antihistamine medications)  An IV will be inserted into one of your veins. Contrast material will be injected into your IV. It will leave your body through your urine within a day. You may be told to drink plenty of fluids to help flush the contrast material out of your system.  You will be asked to remove all metal, including: Watch, jewelry, and other metal objects including hearing aids, hair pieces and dentures. Also wearable glucose monitoring systems (ie. Freestyle Libre and Omnipods) (Braces and fillings normally are not a problem.)   TEST WILL TAKE APPROXIMATELY 1 HOUR  PLEASE NOTIFY SCHEDULING AT LEAST 24 HOURS IN ADVANCE IF YOU ARE UNABLE TO KEEP YOUR APPOINTMENT. [phone]  For more information and frequently asked questions, please visit our website : [email or web address]  Please call the Cardiac Imaging Nurse Navigators with any questions/concerns. [phone] Office

## 2023-07-13 NOTE — Patient Instructions (Addendum)
Medication Instructions:  Your physician recommends that you continue on your current medications as directed. Please refer to the Current Medication list given to you today.  *If you need a refill on your cardiac medications before your next appointment, please call your pharmacy*   Lab Work: Your physician recommends that you return for lab work in: CBC within 2 weeks of cardiac MRI  If you have labs (blood work) drawn today and your tests are completely normal, you will receive your results only by: MyChart Message (if you have MyChart) OR A paper copy in the mail If you have any lab test that is abnormal or we need to change your treatment, we will call you to review the results.   Testing/Procedures: See below   Follow-Up: At [facility], you and your health needs are our priority.  As part of our continuing mission to provide you with exceptional heart care, we have created designated Provider Care Teams.  These Care Teams include your primary Cardiologist (physician) and Advanced Practice Providers (APPs -  Physician Assistants and Nurse Practitioners) who all work together to provide you with the care you need, when you need it.  We recommend signing up for the patient portal called "MyChart".  Sign up information is provided on this After Visit Summary.  MyChart is used to connect with patients for Virtual Visits (Telemedicine).  Patients are able to view lab/test results, encounter notes, upcoming appointments, etc.  Non-urgent messages can be sent to your provider as well.   To learn more about what you can do with MyChart, go to [email or web address].    Your next appointment:   3 month(s)  Provider:   Dr. Jacques Navy  Other Instructions Dr. Jacques Navy recommends wearing compression stockings.      You are scheduled for Cardiac MRI on ______________. Please arrive for your appointment at ______________ ( arrive 30-45 minutes prior to test start time). ?  [facility] [address] [place], [place] [address] [phone] Please take advantage of the free valet parking available at the [facility] and [employer] (Entrance C).  Proceed to the [facility] Radiology Department (First Floor) for check-in.   Magnetic resonance imaging (MRI) is a painless test that produces images of the inside of the body without using Xrays.  During an MRI, strong magnets and radio waves work together in a Data processing manager to form detailed images.   MRI images may provide more details about a medical condition than X-rays, CT scans, and ultrasounds can provide.  You may be given earphones to listen for instructions.  You may eat a light breakfast and take medications as ordered with the exception of furosemide, hydrochlorothiazide, or spironolactone(fluid pill, other). Please avoid stimulants for 12 hr prior to test. (Ie. Caffeine, nicotine, chocolate, or antihistamine medications)  An IV will be inserted into one of your veins. Contrast material will be injected into your IV. It will leave your body through your urine within a day. You may be told to drink plenty of fluids to help flush the contrast material out of your system.  You will be asked to remove all metal, including: Watch, jewelry, and other metal objects including hearing aids, hair pieces and dentures. Also wearable glucose monitoring systems (ie. Freestyle Libre and Omnipods) (Braces and fillings normally are not a problem.)   TEST WILL TAKE APPROXIMATELY 1 HOUR  PLEASE NOTIFY SCHEDULING AT LEAST 24 HOURS IN ADVANCE IF YOU ARE UNABLE TO KEEP YOUR APPOINTMENT. [phone]  For more information and frequently asked questions, please visit our website : [email or web address]  Please call the Cardiac Imaging Nurse Navigators with any questions/concerns. [phone] Office

## 2023-07-18 ENCOUNTER — Other Ambulatory Visit (HOSPITAL_COMMUNITY): Payer: Self-pay

## 2023-07-28 ENCOUNTER — Other Ambulatory Visit (HOSPITAL_COMMUNITY): Payer: Self-pay

## 2023-09-26 ENCOUNTER — Other Ambulatory Visit (HOSPITAL_COMMUNITY): Payer: BLUE CROSS/BLUE SHIELD

## 2023-10-05 ENCOUNTER — Other Ambulatory Visit (HOSPITAL_COMMUNITY): Payer: BLUE CROSS/BLUE SHIELD

## 2023-11-02 ENCOUNTER — Ambulatory Visit: Payer: BLUE CROSS/BLUE SHIELD | Admitting: Internal Medicine
